# Patient Record
Sex: Female | Born: 1935 | Race: White | Hispanic: No | Marital: Married | Smoking: Never smoker
Health system: Southern US, Community
[De-identification: ages and names within clinical notes are randomized; demographics above are authoritative.]

## PROBLEM LIST (undated history)

## (undated) DIAGNOSIS — E119 Type 2 diabetes mellitus without complications: Secondary | ICD-10-CM

## (undated) DIAGNOSIS — K219 Gastro-esophageal reflux disease without esophagitis: Secondary | ICD-10-CM

## (undated) DIAGNOSIS — E039 Hypothyroidism, unspecified: Secondary | ICD-10-CM

## (undated) DIAGNOSIS — O24410 Gestational diabetes mellitus in pregnancy, diet controlled: Secondary | ICD-10-CM

## (undated) DIAGNOSIS — I499 Cardiac arrhythmia, unspecified: Secondary | ICD-10-CM

## (undated) DIAGNOSIS — C50919 Malignant neoplasm of unspecified site of unspecified female breast: Secondary | ICD-10-CM

## (undated) DIAGNOSIS — I10 Essential (primary) hypertension: Secondary | ICD-10-CM

## (undated) DIAGNOSIS — E785 Hyperlipidemia, unspecified: Secondary | ICD-10-CM

## (undated) DIAGNOSIS — C801 Malignant (primary) neoplasm, unspecified: Secondary | ICD-10-CM

## (undated) DIAGNOSIS — E079 Disorder of thyroid, unspecified: Secondary | ICD-10-CM

## (undated) DIAGNOSIS — K5792 Diverticulitis of intestine, part unspecified, without perforation or abscess without bleeding: Secondary | ICD-10-CM

## (undated) DIAGNOSIS — I509 Heart failure, unspecified: Secondary | ICD-10-CM

## (undated) DIAGNOSIS — I4891 Unspecified atrial fibrillation: Secondary | ICD-10-CM

## (undated) HISTORY — PX: INSERT / REPLACE / REMOVE PACEMAKER: SUR710

---

## 2002-12-01 HISTORY — PX: COLONOSCOPY: SHX174

## 2004-09-25 ENCOUNTER — Observation Stay (HOSPITAL_COMMUNITY): Admission: AD | Admit: 2004-09-25 | Discharge: 2004-09-26 | Payer: Self-pay | Admitting: Ophthalmology

## 2004-11-13 ENCOUNTER — Ambulatory Visit (HOSPITAL_COMMUNITY): Admission: RE | Admit: 2004-11-13 | Discharge: 2004-11-13 | Payer: Self-pay | Admitting: Ophthalmology

## 2005-04-07 ENCOUNTER — Ambulatory Visit (HOSPITAL_COMMUNITY): Admission: RE | Admit: 2005-04-07 | Discharge: 2005-04-07 | Payer: Self-pay | Admitting: Ophthalmology

## 2011-12-05 DIAGNOSIS — I4891 Unspecified atrial fibrillation: Secondary | ICD-10-CM | POA: Diagnosis not present

## 2011-12-05 DIAGNOSIS — Z7901 Long term (current) use of anticoagulants: Secondary | ICD-10-CM | POA: Diagnosis not present

## 2011-12-08 DIAGNOSIS — I10 Essential (primary) hypertension: Secondary | ICD-10-CM | POA: Diagnosis not present

## 2011-12-08 DIAGNOSIS — E119 Type 2 diabetes mellitus without complications: Secondary | ICD-10-CM | POA: Diagnosis not present

## 2011-12-08 DIAGNOSIS — G4700 Insomnia, unspecified: Secondary | ICD-10-CM | POA: Diagnosis not present

## 2011-12-08 DIAGNOSIS — E785 Hyperlipidemia, unspecified: Secondary | ICD-10-CM | POA: Diagnosis not present

## 2011-12-08 DIAGNOSIS — Z7901 Long term (current) use of anticoagulants: Secondary | ICD-10-CM | POA: Diagnosis not present

## 2011-12-31 DIAGNOSIS — Z7901 Long term (current) use of anticoagulants: Secondary | ICD-10-CM | POA: Diagnosis not present

## 2011-12-31 DIAGNOSIS — I4891 Unspecified atrial fibrillation: Secondary | ICD-10-CM | POA: Diagnosis not present

## 2012-01-06 DIAGNOSIS — G4700 Insomnia, unspecified: Secondary | ICD-10-CM | POA: Diagnosis not present

## 2012-01-06 DIAGNOSIS — H902 Conductive hearing loss, unspecified: Secondary | ICD-10-CM | POA: Diagnosis not present

## 2012-01-06 DIAGNOSIS — H6120 Impacted cerumen, unspecified ear: Secondary | ICD-10-CM | POA: Diagnosis not present

## 2012-01-06 DIAGNOSIS — Z6828 Body mass index (BMI) 28.0-28.9, adult: Secondary | ICD-10-CM | POA: Diagnosis not present

## 2012-01-14 DIAGNOSIS — Z7901 Long term (current) use of anticoagulants: Secondary | ICD-10-CM | POA: Diagnosis not present

## 2012-01-19 DIAGNOSIS — I498 Other specified cardiac arrhythmias: Secondary | ICD-10-CM | POA: Diagnosis not present

## 2012-02-10 DIAGNOSIS — Z7901 Long term (current) use of anticoagulants: Secondary | ICD-10-CM | POA: Diagnosis not present

## 2012-02-10 DIAGNOSIS — I4891 Unspecified atrial fibrillation: Secondary | ICD-10-CM | POA: Diagnosis not present

## 2012-02-26 DIAGNOSIS — H353190 Nonexudative age-related macular degeneration, unspecified eye, stage unspecified: Secondary | ICD-10-CM | POA: Diagnosis not present

## 2012-02-26 DIAGNOSIS — H35379 Puckering of macula, unspecified eye: Secondary | ICD-10-CM | POA: Diagnosis not present

## 2012-02-26 DIAGNOSIS — H31009 Unspecified chorioretinal scars, unspecified eye: Secondary | ICD-10-CM | POA: Diagnosis not present

## 2012-03-10 DIAGNOSIS — Z7901 Long term (current) use of anticoagulants: Secondary | ICD-10-CM | POA: Diagnosis not present

## 2012-03-10 DIAGNOSIS — I4891 Unspecified atrial fibrillation: Secondary | ICD-10-CM | POA: Diagnosis not present

## 2012-03-15 DIAGNOSIS — E039 Hypothyroidism, unspecified: Secondary | ICD-10-CM | POA: Diagnosis not present

## 2012-03-15 DIAGNOSIS — E119 Type 2 diabetes mellitus without complications: Secondary | ICD-10-CM | POA: Diagnosis not present

## 2012-03-15 DIAGNOSIS — I10 Essential (primary) hypertension: Secondary | ICD-10-CM | POA: Diagnosis not present

## 2012-03-15 DIAGNOSIS — E785 Hyperlipidemia, unspecified: Secondary | ICD-10-CM | POA: Diagnosis not present

## 2012-03-15 DIAGNOSIS — Z6834 Body mass index (BMI) 34.0-34.9, adult: Secondary | ICD-10-CM | POA: Diagnosis not present

## 2012-03-15 DIAGNOSIS — Z6828 Body mass index (BMI) 28.0-28.9, adult: Secondary | ICD-10-CM | POA: Diagnosis not present

## 2012-04-07 DIAGNOSIS — I4891 Unspecified atrial fibrillation: Secondary | ICD-10-CM | POA: Diagnosis not present

## 2012-04-07 DIAGNOSIS — Z7901 Long term (current) use of anticoagulants: Secondary | ICD-10-CM | POA: Diagnosis not present

## 2012-04-22 DIAGNOSIS — Z950 Presence of cardiac pacemaker: Secondary | ICD-10-CM | POA: Diagnosis not present

## 2012-05-05 DIAGNOSIS — Z7901 Long term (current) use of anticoagulants: Secondary | ICD-10-CM | POA: Diagnosis not present

## 2012-05-05 DIAGNOSIS — I4891 Unspecified atrial fibrillation: Secondary | ICD-10-CM | POA: Diagnosis not present

## 2012-06-07 DIAGNOSIS — I10 Essential (primary) hypertension: Secondary | ICD-10-CM | POA: Diagnosis not present

## 2012-06-07 DIAGNOSIS — I4891 Unspecified atrial fibrillation: Secondary | ICD-10-CM | POA: Diagnosis not present

## 2012-06-07 DIAGNOSIS — Z950 Presence of cardiac pacemaker: Secondary | ICD-10-CM | POA: Diagnosis not present

## 2012-06-07 DIAGNOSIS — E785 Hyperlipidemia, unspecified: Secondary | ICD-10-CM | POA: Diagnosis not present

## 2012-06-10 DIAGNOSIS — W1809XA Striking against other object with subsequent fall, initial encounter: Secondary | ICD-10-CM | POA: Diagnosis not present

## 2012-06-10 DIAGNOSIS — S060X0A Concussion without loss of consciousness, initial encounter: Secondary | ICD-10-CM | POA: Diagnosis not present

## 2012-06-10 DIAGNOSIS — Z7901 Long term (current) use of anticoagulants: Secondary | ICD-10-CM | POA: Diagnosis not present

## 2012-06-10 DIAGNOSIS — S0990XA Unspecified injury of head, initial encounter: Secondary | ICD-10-CM | POA: Diagnosis not present

## 2012-06-21 DIAGNOSIS — E785 Hyperlipidemia, unspecified: Secondary | ICD-10-CM | POA: Diagnosis not present

## 2012-06-21 DIAGNOSIS — Z6827 Body mass index (BMI) 27.0-27.9, adult: Secondary | ICD-10-CM | POA: Diagnosis not present

## 2012-06-21 DIAGNOSIS — E039 Hypothyroidism, unspecified: Secondary | ICD-10-CM | POA: Diagnosis not present

## 2012-06-21 DIAGNOSIS — E119 Type 2 diabetes mellitus without complications: Secondary | ICD-10-CM | POA: Diagnosis not present

## 2012-06-21 DIAGNOSIS — G4700 Insomnia, unspecified: Secondary | ICD-10-CM | POA: Diagnosis not present

## 2012-06-28 DIAGNOSIS — I4891 Unspecified atrial fibrillation: Secondary | ICD-10-CM | POA: Diagnosis not present

## 2012-06-28 DIAGNOSIS — R0602 Shortness of breath: Secondary | ICD-10-CM | POA: Diagnosis not present

## 2012-06-28 DIAGNOSIS — R011 Cardiac murmur, unspecified: Secondary | ICD-10-CM | POA: Diagnosis not present

## 2012-07-15 DIAGNOSIS — E785 Hyperlipidemia, unspecified: Secondary | ICD-10-CM | POA: Diagnosis not present

## 2012-07-15 DIAGNOSIS — I10 Essential (primary) hypertension: Secondary | ICD-10-CM | POA: Diagnosis not present

## 2012-07-15 DIAGNOSIS — I4891 Unspecified atrial fibrillation: Secondary | ICD-10-CM | POA: Diagnosis not present

## 2012-07-19 DIAGNOSIS — G4700 Insomnia, unspecified: Secondary | ICD-10-CM | POA: Diagnosis not present

## 2012-07-19 DIAGNOSIS — Z6827 Body mass index (BMI) 27.0-27.9, adult: Secondary | ICD-10-CM | POA: Diagnosis not present

## 2012-07-19 DIAGNOSIS — E871 Hypo-osmolality and hyponatremia: Secondary | ICD-10-CM | POA: Diagnosis not present

## 2012-07-26 DIAGNOSIS — I498 Other specified cardiac arrhythmias: Secondary | ICD-10-CM | POA: Diagnosis not present

## 2012-08-16 DIAGNOSIS — Z7901 Long term (current) use of anticoagulants: Secondary | ICD-10-CM | POA: Diagnosis not present

## 2012-08-16 DIAGNOSIS — I4891 Unspecified atrial fibrillation: Secondary | ICD-10-CM | POA: Diagnosis not present

## 2012-09-10 DIAGNOSIS — Z23 Encounter for immunization: Secondary | ICD-10-CM | POA: Diagnosis not present

## 2012-09-13 DIAGNOSIS — I4891 Unspecified atrial fibrillation: Secondary | ICD-10-CM | POA: Diagnosis not present

## 2012-09-13 DIAGNOSIS — Z7901 Long term (current) use of anticoagulants: Secondary | ICD-10-CM | POA: Diagnosis not present

## 2012-09-13 DIAGNOSIS — Z950 Presence of cardiac pacemaker: Secondary | ICD-10-CM | POA: Diagnosis not present

## 2012-10-11 DIAGNOSIS — I4891 Unspecified atrial fibrillation: Secondary | ICD-10-CM | POA: Diagnosis not present

## 2012-10-11 DIAGNOSIS — Z7901 Long term (current) use of anticoagulants: Secondary | ICD-10-CM | POA: Diagnosis not present

## 2012-10-11 DIAGNOSIS — Z950 Presence of cardiac pacemaker: Secondary | ICD-10-CM | POA: Diagnosis not present

## 2012-10-21 DIAGNOSIS — E785 Hyperlipidemia, unspecified: Secondary | ICD-10-CM | POA: Diagnosis not present

## 2012-10-21 DIAGNOSIS — E039 Hypothyroidism, unspecified: Secondary | ICD-10-CM | POA: Diagnosis not present

## 2012-10-21 DIAGNOSIS — E119 Type 2 diabetes mellitus without complications: Secondary | ICD-10-CM | POA: Diagnosis not present

## 2012-10-21 DIAGNOSIS — I10 Essential (primary) hypertension: Secondary | ICD-10-CM | POA: Diagnosis not present

## 2012-10-21 DIAGNOSIS — Z6828 Body mass index (BMI) 28.0-28.9, adult: Secondary | ICD-10-CM | POA: Diagnosis not present

## 2012-11-09 DIAGNOSIS — I4891 Unspecified atrial fibrillation: Secondary | ICD-10-CM | POA: Diagnosis not present

## 2012-11-09 DIAGNOSIS — Z7901 Long term (current) use of anticoagulants: Secondary | ICD-10-CM | POA: Diagnosis not present

## 2012-12-07 DIAGNOSIS — Z7901 Long term (current) use of anticoagulants: Secondary | ICD-10-CM | POA: Diagnosis not present

## 2012-12-07 DIAGNOSIS — I4891 Unspecified atrial fibrillation: Secondary | ICD-10-CM | POA: Diagnosis not present

## 2012-12-21 DIAGNOSIS — I4891 Unspecified atrial fibrillation: Secondary | ICD-10-CM | POA: Diagnosis not present

## 2012-12-21 DIAGNOSIS — Z7901 Long term (current) use of anticoagulants: Secondary | ICD-10-CM | POA: Diagnosis not present

## 2013-01-18 DIAGNOSIS — Z7901 Long term (current) use of anticoagulants: Secondary | ICD-10-CM | POA: Diagnosis not present

## 2013-01-18 DIAGNOSIS — I4891 Unspecified atrial fibrillation: Secondary | ICD-10-CM | POA: Diagnosis not present

## 2013-01-24 DIAGNOSIS — Z7901 Long term (current) use of anticoagulants: Secondary | ICD-10-CM | POA: Diagnosis not present

## 2013-01-24 DIAGNOSIS — E785 Hyperlipidemia, unspecified: Secondary | ICD-10-CM | POA: Diagnosis not present

## 2013-01-24 DIAGNOSIS — E119 Type 2 diabetes mellitus without complications: Secondary | ICD-10-CM | POA: Diagnosis not present

## 2013-01-24 DIAGNOSIS — I10 Essential (primary) hypertension: Secondary | ICD-10-CM | POA: Diagnosis not present

## 2013-01-24 DIAGNOSIS — Z6828 Body mass index (BMI) 28.0-28.9, adult: Secondary | ICD-10-CM | POA: Diagnosis not present

## 2013-02-07 DIAGNOSIS — E876 Hypokalemia: Secondary | ICD-10-CM | POA: Diagnosis not present

## 2013-02-14 DIAGNOSIS — I498 Other specified cardiac arrhythmias: Secondary | ICD-10-CM | POA: Diagnosis not present

## 2013-02-16 DIAGNOSIS — Z7901 Long term (current) use of anticoagulants: Secondary | ICD-10-CM | POA: Diagnosis not present

## 2013-02-16 DIAGNOSIS — I4891 Unspecified atrial fibrillation: Secondary | ICD-10-CM | POA: Diagnosis not present

## 2013-03-16 DIAGNOSIS — I4891 Unspecified atrial fibrillation: Secondary | ICD-10-CM | POA: Diagnosis not present

## 2013-03-16 DIAGNOSIS — Z7901 Long term (current) use of anticoagulants: Secondary | ICD-10-CM | POA: Diagnosis not present

## 2013-04-14 DIAGNOSIS — Z7901 Long term (current) use of anticoagulants: Secondary | ICD-10-CM | POA: Diagnosis not present

## 2013-04-14 DIAGNOSIS — I4891 Unspecified atrial fibrillation: Secondary | ICD-10-CM | POA: Diagnosis not present

## 2013-04-28 DIAGNOSIS — Z0000 Encounter for general adult medical examination without abnormal findings: Secondary | ICD-10-CM | POA: Diagnosis not present

## 2013-04-28 DIAGNOSIS — Z7901 Long term (current) use of anticoagulants: Secondary | ICD-10-CM | POA: Diagnosis not present

## 2013-04-28 DIAGNOSIS — E785 Hyperlipidemia, unspecified: Secondary | ICD-10-CM | POA: Diagnosis not present

## 2013-04-28 DIAGNOSIS — Z6828 Body mass index (BMI) 28.0-28.9, adult: Secondary | ICD-10-CM | POA: Diagnosis not present

## 2013-04-28 DIAGNOSIS — Z1331 Encounter for screening for depression: Secondary | ICD-10-CM | POA: Diagnosis not present

## 2013-04-28 DIAGNOSIS — Z9181 History of falling: Secondary | ICD-10-CM | POA: Diagnosis not present

## 2013-04-28 DIAGNOSIS — E119 Type 2 diabetes mellitus without complications: Secondary | ICD-10-CM | POA: Diagnosis not present

## 2013-05-11 DIAGNOSIS — I4891 Unspecified atrial fibrillation: Secondary | ICD-10-CM | POA: Diagnosis not present

## 2013-05-11 DIAGNOSIS — Z7901 Long term (current) use of anticoagulants: Secondary | ICD-10-CM | POA: Diagnosis not present

## 2013-05-26 DIAGNOSIS — Z79899 Other long term (current) drug therapy: Secondary | ICD-10-CM | POA: Diagnosis not present

## 2013-05-26 DIAGNOSIS — I4891 Unspecified atrial fibrillation: Secondary | ICD-10-CM | POA: Diagnosis not present

## 2013-05-26 DIAGNOSIS — Z950 Presence of cardiac pacemaker: Secondary | ICD-10-CM | POA: Diagnosis not present

## 2013-06-08 DIAGNOSIS — I4891 Unspecified atrial fibrillation: Secondary | ICD-10-CM | POA: Diagnosis not present

## 2013-06-08 DIAGNOSIS — Z7901 Long term (current) use of anticoagulants: Secondary | ICD-10-CM | POA: Diagnosis not present

## 2013-07-13 DIAGNOSIS — Z7901 Long term (current) use of anticoagulants: Secondary | ICD-10-CM | POA: Diagnosis not present

## 2013-07-13 DIAGNOSIS — I4891 Unspecified atrial fibrillation: Secondary | ICD-10-CM | POA: Diagnosis not present

## 2013-07-28 DIAGNOSIS — I4891 Unspecified atrial fibrillation: Secondary | ICD-10-CM | POA: Diagnosis not present

## 2013-07-28 DIAGNOSIS — Z7901 Long term (current) use of anticoagulants: Secondary | ICD-10-CM | POA: Diagnosis not present

## 2013-08-24 DIAGNOSIS — Z7901 Long term (current) use of anticoagulants: Secondary | ICD-10-CM | POA: Diagnosis not present

## 2013-08-24 DIAGNOSIS — Z23 Encounter for immunization: Secondary | ICD-10-CM | POA: Diagnosis not present

## 2013-08-24 DIAGNOSIS — I4891 Unspecified atrial fibrillation: Secondary | ICD-10-CM | POA: Diagnosis not present

## 2013-08-29 DIAGNOSIS — I498 Other specified cardiac arrhythmias: Secondary | ICD-10-CM | POA: Diagnosis not present

## 2013-09-01 DIAGNOSIS — E119 Type 2 diabetes mellitus without complications: Secondary | ICD-10-CM | POA: Diagnosis not present

## 2013-09-01 DIAGNOSIS — Z6828 Body mass index (BMI) 28.0-28.9, adult: Secondary | ICD-10-CM | POA: Diagnosis not present

## 2013-09-01 DIAGNOSIS — Z7901 Long term (current) use of anticoagulants: Secondary | ICD-10-CM | POA: Diagnosis not present

## 2013-09-01 DIAGNOSIS — I10 Essential (primary) hypertension: Secondary | ICD-10-CM | POA: Diagnosis not present

## 2013-09-01 DIAGNOSIS — E785 Hyperlipidemia, unspecified: Secondary | ICD-10-CM | POA: Diagnosis not present

## 2013-09-01 DIAGNOSIS — E039 Hypothyroidism, unspecified: Secondary | ICD-10-CM | POA: Diagnosis not present

## 2013-09-12 DIAGNOSIS — Z7901 Long term (current) use of anticoagulants: Secondary | ICD-10-CM | POA: Diagnosis not present

## 2013-09-12 DIAGNOSIS — I4891 Unspecified atrial fibrillation: Secondary | ICD-10-CM | POA: Diagnosis not present

## 2013-10-10 DIAGNOSIS — I4891 Unspecified atrial fibrillation: Secondary | ICD-10-CM | POA: Diagnosis not present

## 2013-10-10 DIAGNOSIS — Z7901 Long term (current) use of anticoagulants: Secondary | ICD-10-CM | POA: Diagnosis not present

## 2013-10-24 DIAGNOSIS — I4891 Unspecified atrial fibrillation: Secondary | ICD-10-CM | POA: Diagnosis not present

## 2013-11-07 DIAGNOSIS — Z7901 Long term (current) use of anticoagulants: Secondary | ICD-10-CM | POA: Diagnosis not present

## 2013-11-07 DIAGNOSIS — I4891 Unspecified atrial fibrillation: Secondary | ICD-10-CM | POA: Diagnosis not present

## 2013-11-28 DIAGNOSIS — Z7901 Long term (current) use of anticoagulants: Secondary | ICD-10-CM | POA: Diagnosis not present

## 2013-11-28 DIAGNOSIS — I4891 Unspecified atrial fibrillation: Secondary | ICD-10-CM | POA: Diagnosis not present

## 2013-12-05 DIAGNOSIS — I498 Other specified cardiac arrhythmias: Secondary | ICD-10-CM | POA: Diagnosis not present

## 2013-12-26 DIAGNOSIS — Z7901 Long term (current) use of anticoagulants: Secondary | ICD-10-CM | POA: Diagnosis not present

## 2013-12-26 DIAGNOSIS — I4891 Unspecified atrial fibrillation: Secondary | ICD-10-CM | POA: Diagnosis not present

## 2014-01-16 DIAGNOSIS — I10 Essential (primary) hypertension: Secondary | ICD-10-CM | POA: Diagnosis not present

## 2014-01-16 DIAGNOSIS — Z961 Presence of intraocular lens: Secondary | ICD-10-CM | POA: Diagnosis not present

## 2014-01-16 DIAGNOSIS — Z9849 Cataract extraction status, unspecified eye: Secondary | ICD-10-CM | POA: Diagnosis not present

## 2014-01-16 DIAGNOSIS — H33059 Total retinal detachment, unspecified eye: Secondary | ICD-10-CM | POA: Diagnosis not present

## 2014-01-19 DIAGNOSIS — I10 Essential (primary) hypertension: Secondary | ICD-10-CM | POA: Diagnosis not present

## 2014-01-19 DIAGNOSIS — Z7901 Long term (current) use of anticoagulants: Secondary | ICD-10-CM | POA: Diagnosis not present

## 2014-01-19 DIAGNOSIS — Z79899 Other long term (current) drug therapy: Secondary | ICD-10-CM | POA: Diagnosis not present

## 2014-01-19 DIAGNOSIS — I4891 Unspecified atrial fibrillation: Secondary | ICD-10-CM | POA: Diagnosis not present

## 2014-01-23 DIAGNOSIS — I4891 Unspecified atrial fibrillation: Secondary | ICD-10-CM | POA: Diagnosis not present

## 2014-01-23 DIAGNOSIS — Z7901 Long term (current) use of anticoagulants: Secondary | ICD-10-CM | POA: Diagnosis not present

## 2014-01-30 DIAGNOSIS — Z7901 Long term (current) use of anticoagulants: Secondary | ICD-10-CM | POA: Diagnosis not present

## 2014-01-30 DIAGNOSIS — I4891 Unspecified atrial fibrillation: Secondary | ICD-10-CM | POA: Diagnosis not present

## 2014-02-07 DIAGNOSIS — Z7901 Long term (current) use of anticoagulants: Secondary | ICD-10-CM | POA: Diagnosis not present

## 2014-02-07 DIAGNOSIS — I4891 Unspecified atrial fibrillation: Secondary | ICD-10-CM | POA: Diagnosis not present

## 2014-02-23 DIAGNOSIS — I4891 Unspecified atrial fibrillation: Secondary | ICD-10-CM | POA: Diagnosis not present

## 2014-02-23 DIAGNOSIS — Z7901 Long term (current) use of anticoagulants: Secondary | ICD-10-CM | POA: Diagnosis not present

## 2014-03-07 DIAGNOSIS — Z7901 Long term (current) use of anticoagulants: Secondary | ICD-10-CM | POA: Diagnosis not present

## 2014-03-07 DIAGNOSIS — I4891 Unspecified atrial fibrillation: Secondary | ICD-10-CM | POA: Diagnosis not present

## 2014-03-13 DIAGNOSIS — I498 Other specified cardiac arrhythmias: Secondary | ICD-10-CM | POA: Diagnosis not present

## 2014-03-21 DIAGNOSIS — I4891 Unspecified atrial fibrillation: Secondary | ICD-10-CM | POA: Diagnosis not present

## 2014-03-21 DIAGNOSIS — Z7901 Long term (current) use of anticoagulants: Secondary | ICD-10-CM | POA: Diagnosis not present

## 2014-03-28 DIAGNOSIS — H903 Sensorineural hearing loss, bilateral: Secondary | ICD-10-CM | POA: Diagnosis not present

## 2014-04-04 DIAGNOSIS — Z7901 Long term (current) use of anticoagulants: Secondary | ICD-10-CM | POA: Diagnosis not present

## 2014-04-04 DIAGNOSIS — I4891 Unspecified atrial fibrillation: Secondary | ICD-10-CM | POA: Diagnosis not present

## 2014-04-26 DIAGNOSIS — Z7901 Long term (current) use of anticoagulants: Secondary | ICD-10-CM | POA: Diagnosis not present

## 2014-04-26 DIAGNOSIS — I4891 Unspecified atrial fibrillation: Secondary | ICD-10-CM | POA: Diagnosis not present

## 2014-05-04 DIAGNOSIS — Z6829 Body mass index (BMI) 29.0-29.9, adult: Secondary | ICD-10-CM | POA: Diagnosis not present

## 2014-05-04 DIAGNOSIS — Z1331 Encounter for screening for depression: Secondary | ICD-10-CM | POA: Diagnosis not present

## 2014-05-04 DIAGNOSIS — I10 Essential (primary) hypertension: Secondary | ICD-10-CM | POA: Diagnosis not present

## 2014-05-04 DIAGNOSIS — E039 Hypothyroidism, unspecified: Secondary | ICD-10-CM | POA: Diagnosis not present

## 2014-05-04 DIAGNOSIS — Z9181 History of falling: Secondary | ICD-10-CM | POA: Diagnosis not present

## 2014-05-04 DIAGNOSIS — E785 Hyperlipidemia, unspecified: Secondary | ICD-10-CM | POA: Diagnosis not present

## 2014-05-04 DIAGNOSIS — E119 Type 2 diabetes mellitus without complications: Secondary | ICD-10-CM | POA: Diagnosis not present

## 2014-05-18 DIAGNOSIS — E876 Hypokalemia: Secondary | ICD-10-CM | POA: Diagnosis not present

## 2014-05-25 DIAGNOSIS — Z950 Presence of cardiac pacemaker: Secondary | ICD-10-CM | POA: Diagnosis not present

## 2014-05-25 DIAGNOSIS — I4891 Unspecified atrial fibrillation: Secondary | ICD-10-CM | POA: Diagnosis not present

## 2014-06-06 DIAGNOSIS — H903 Sensorineural hearing loss, bilateral: Secondary | ICD-10-CM | POA: Diagnosis not present

## 2014-06-12 DIAGNOSIS — I10 Essential (primary) hypertension: Secondary | ICD-10-CM | POA: Diagnosis not present

## 2014-06-12 DIAGNOSIS — I4891 Unspecified atrial fibrillation: Secondary | ICD-10-CM | POA: Diagnosis not present

## 2014-06-12 DIAGNOSIS — Z79899 Other long term (current) drug therapy: Secondary | ICD-10-CM | POA: Diagnosis not present

## 2014-06-12 DIAGNOSIS — Z950 Presence of cardiac pacemaker: Secondary | ICD-10-CM | POA: Diagnosis not present

## 2014-06-12 DIAGNOSIS — E785 Hyperlipidemia, unspecified: Secondary | ICD-10-CM | POA: Diagnosis not present

## 2014-07-04 DIAGNOSIS — Z950 Presence of cardiac pacemaker: Secondary | ICD-10-CM | POA: Diagnosis not present

## 2014-07-04 DIAGNOSIS — I4891 Unspecified atrial fibrillation: Secondary | ICD-10-CM | POA: Diagnosis not present

## 2014-09-07 DIAGNOSIS — I10 Essential (primary) hypertension: Secondary | ICD-10-CM | POA: Diagnosis not present

## 2014-09-07 DIAGNOSIS — E119 Type 2 diabetes mellitus without complications: Secondary | ICD-10-CM | POA: Diagnosis not present

## 2014-09-07 DIAGNOSIS — E785 Hyperlipidemia, unspecified: Secondary | ICD-10-CM | POA: Diagnosis not present

## 2014-09-07 DIAGNOSIS — Z23 Encounter for immunization: Secondary | ICD-10-CM | POA: Diagnosis not present

## 2014-09-07 DIAGNOSIS — R5381 Other malaise: Secondary | ICD-10-CM | POA: Diagnosis not present

## 2014-09-07 DIAGNOSIS — I4891 Unspecified atrial fibrillation: Secondary | ICD-10-CM | POA: Diagnosis not present

## 2014-09-07 DIAGNOSIS — Z6829 Body mass index (BMI) 29.0-29.9, adult: Secondary | ICD-10-CM | POA: Diagnosis not present

## 2014-12-28 DIAGNOSIS — I498 Other specified cardiac arrhythmias: Secondary | ICD-10-CM | POA: Diagnosis not present

## 2015-01-09 DIAGNOSIS — Z6829 Body mass index (BMI) 29.0-29.9, adult: Secondary | ICD-10-CM | POA: Diagnosis not present

## 2015-01-09 DIAGNOSIS — I10 Essential (primary) hypertension: Secondary | ICD-10-CM | POA: Diagnosis not present

## 2015-01-09 DIAGNOSIS — E119 Type 2 diabetes mellitus without complications: Secondary | ICD-10-CM | POA: Diagnosis not present

## 2015-01-09 DIAGNOSIS — I4891 Unspecified atrial fibrillation: Secondary | ICD-10-CM | POA: Diagnosis not present

## 2015-04-12 DIAGNOSIS — H6123 Impacted cerumen, bilateral: Secondary | ICD-10-CM | POA: Diagnosis not present

## 2015-04-19 DIAGNOSIS — M25551 Pain in right hip: Secondary | ICD-10-CM | POA: Diagnosis not present

## 2015-04-19 DIAGNOSIS — M545 Low back pain: Secondary | ICD-10-CM | POA: Diagnosis not present

## 2015-04-23 DIAGNOSIS — M25551 Pain in right hip: Secondary | ICD-10-CM | POA: Diagnosis not present

## 2015-04-23 DIAGNOSIS — M545 Low back pain: Secondary | ICD-10-CM | POA: Diagnosis not present

## 2015-04-23 DIAGNOSIS — M5136 Other intervertebral disc degeneration, lumbar region: Secondary | ICD-10-CM | POA: Diagnosis not present

## 2015-04-23 DIAGNOSIS — M160 Bilateral primary osteoarthritis of hip: Secondary | ICD-10-CM | POA: Diagnosis not present

## 2015-04-23 DIAGNOSIS — M1611 Unilateral primary osteoarthritis, right hip: Secondary | ICD-10-CM | POA: Diagnosis not present

## 2015-04-23 DIAGNOSIS — M47816 Spondylosis without myelopathy or radiculopathy, lumbar region: Secondary | ICD-10-CM | POA: Diagnosis not present

## 2015-05-03 DIAGNOSIS — S39012D Strain of muscle, fascia and tendon of lower back, subsequent encounter: Secondary | ICD-10-CM | POA: Diagnosis not present

## 2015-05-07 DIAGNOSIS — S39012D Strain of muscle, fascia and tendon of lower back, subsequent encounter: Secondary | ICD-10-CM | POA: Diagnosis not present

## 2015-05-11 DIAGNOSIS — S39012D Strain of muscle, fascia and tendon of lower back, subsequent encounter: Secondary | ICD-10-CM | POA: Diagnosis not present

## 2015-05-14 DIAGNOSIS — S39012D Strain of muscle, fascia and tendon of lower back, subsequent encounter: Secondary | ICD-10-CM | POA: Diagnosis not present

## 2015-05-17 DIAGNOSIS — E039 Hypothyroidism, unspecified: Secondary | ICD-10-CM | POA: Diagnosis not present

## 2015-05-17 DIAGNOSIS — I4891 Unspecified atrial fibrillation: Secondary | ICD-10-CM | POA: Diagnosis not present

## 2015-05-17 DIAGNOSIS — I10 Essential (primary) hypertension: Secondary | ICD-10-CM | POA: Diagnosis not present

## 2015-05-17 DIAGNOSIS — M25551 Pain in right hip: Secondary | ICD-10-CM | POA: Diagnosis not present

## 2015-05-17 DIAGNOSIS — Z6829 Body mass index (BMI) 29.0-29.9, adult: Secondary | ICD-10-CM | POA: Diagnosis not present

## 2015-05-17 DIAGNOSIS — E119 Type 2 diabetes mellitus without complications: Secondary | ICD-10-CM | POA: Diagnosis not present

## 2015-05-17 DIAGNOSIS — Z9181 History of falling: Secondary | ICD-10-CM | POA: Diagnosis not present

## 2015-05-18 DIAGNOSIS — S39012D Strain of muscle, fascia and tendon of lower back, subsequent encounter: Secondary | ICD-10-CM | POA: Diagnosis not present

## 2015-05-21 DIAGNOSIS — S39012D Strain of muscle, fascia and tendon of lower back, subsequent encounter: Secondary | ICD-10-CM | POA: Diagnosis not present

## 2015-06-14 DIAGNOSIS — Z6829 Body mass index (BMI) 29.0-29.9, adult: Secondary | ICD-10-CM | POA: Diagnosis not present

## 2015-06-14 DIAGNOSIS — M5441 Lumbago with sciatica, right side: Secondary | ICD-10-CM | POA: Diagnosis not present

## 2015-06-14 DIAGNOSIS — M4727 Other spondylosis with radiculopathy, lumbosacral region: Secondary | ICD-10-CM | POA: Diagnosis not present

## 2015-06-14 DIAGNOSIS — M545 Low back pain: Secondary | ICD-10-CM | POA: Diagnosis not present

## 2015-06-25 DIAGNOSIS — I4891 Unspecified atrial fibrillation: Secondary | ICD-10-CM | POA: Diagnosis not present

## 2015-06-25 DIAGNOSIS — Z6829 Body mass index (BMI) 29.0-29.9, adult: Secondary | ICD-10-CM | POA: Diagnosis not present

## 2015-06-25 DIAGNOSIS — L03115 Cellulitis of right lower limb: Secondary | ICD-10-CM | POA: Diagnosis not present

## 2015-07-05 DIAGNOSIS — M4727 Other spondylosis with radiculopathy, lumbosacral region: Secondary | ICD-10-CM | POA: Diagnosis not present

## 2015-07-05 DIAGNOSIS — Z6828 Body mass index (BMI) 28.0-28.9, adult: Secondary | ICD-10-CM | POA: Diagnosis not present

## 2015-07-05 DIAGNOSIS — L03115 Cellulitis of right lower limb: Secondary | ICD-10-CM | POA: Diagnosis not present

## 2015-07-19 DIAGNOSIS — M4727 Other spondylosis with radiculopathy, lumbosacral region: Secondary | ICD-10-CM | POA: Diagnosis not present

## 2015-07-19 DIAGNOSIS — Z6829 Body mass index (BMI) 29.0-29.9, adult: Secondary | ICD-10-CM | POA: Diagnosis not present

## 2015-07-23 DIAGNOSIS — M5416 Radiculopathy, lumbar region: Secondary | ICD-10-CM | POA: Diagnosis not present

## 2015-07-30 ENCOUNTER — Other Ambulatory Visit: Payer: Self-pay | Admitting: Orthopedic Surgery

## 2015-07-30 DIAGNOSIS — M545 Low back pain: Secondary | ICD-10-CM

## 2015-08-03 DIAGNOSIS — I10 Essential (primary) hypertension: Secondary | ICD-10-CM | POA: Diagnosis present

## 2015-08-03 DIAGNOSIS — R197 Diarrhea, unspecified: Secondary | ICD-10-CM | POA: Diagnosis not present

## 2015-08-03 DIAGNOSIS — E119 Type 2 diabetes mellitus without complications: Secondary | ICD-10-CM | POA: Diagnosis present

## 2015-08-03 DIAGNOSIS — E780 Pure hypercholesterolemia: Secondary | ICD-10-CM | POA: Diagnosis present

## 2015-08-03 DIAGNOSIS — K920 Hematemesis: Secondary | ICD-10-CM | POA: Diagnosis present

## 2015-08-03 DIAGNOSIS — F418 Other specified anxiety disorders: Secondary | ICD-10-CM | POA: Diagnosis present

## 2015-08-03 DIAGNOSIS — R55 Syncope and collapse: Secondary | ICD-10-CM | POA: Diagnosis present

## 2015-08-03 DIAGNOSIS — R112 Nausea with vomiting, unspecified: Secondary | ICD-10-CM | POA: Diagnosis not present

## 2015-08-03 DIAGNOSIS — I482 Chronic atrial fibrillation: Secondary | ICD-10-CM | POA: Diagnosis present

## 2015-08-03 DIAGNOSIS — R110 Nausea: Secondary | ICD-10-CM | POA: Diagnosis not present

## 2015-08-03 DIAGNOSIS — E039 Hypothyroidism, unspecified: Secondary | ICD-10-CM | POA: Diagnosis present

## 2015-08-03 DIAGNOSIS — Z950 Presence of cardiac pacemaker: Secondary | ICD-10-CM | POA: Diagnosis not present

## 2015-08-03 DIAGNOSIS — R404 Transient alteration of awareness: Secondary | ICD-10-CM | POA: Diagnosis not present

## 2015-08-03 DIAGNOSIS — K219 Gastro-esophageal reflux disease without esophagitis: Secondary | ICD-10-CM | POA: Diagnosis present

## 2015-08-03 DIAGNOSIS — D62 Acute posthemorrhagic anemia: Secondary | ICD-10-CM | POA: Diagnosis not present

## 2015-08-03 DIAGNOSIS — K921 Melena: Secondary | ICD-10-CM | POA: Diagnosis present

## 2015-08-03 DIAGNOSIS — Z853 Personal history of malignant neoplasm of breast: Secondary | ICD-10-CM | POA: Diagnosis not present

## 2015-08-03 DIAGNOSIS — E876 Hypokalemia: Secondary | ICD-10-CM | POA: Diagnosis present

## 2015-08-03 DIAGNOSIS — I509 Heart failure, unspecified: Secondary | ICD-10-CM | POA: Diagnosis present

## 2015-08-03 DIAGNOSIS — R531 Weakness: Secondary | ICD-10-CM | POA: Diagnosis not present

## 2015-08-03 DIAGNOSIS — Z79899 Other long term (current) drug therapy: Secondary | ICD-10-CM | POA: Diagnosis not present

## 2015-08-04 ENCOUNTER — Encounter (HOSPITAL_COMMUNITY): Payer: Self-pay | Admitting: Nurse Practitioner

## 2015-08-04 ENCOUNTER — Encounter (HOSPITAL_COMMUNITY)
Admission: AD | Disposition: A | Discharge status: Home / Self Care | Payer: Self-pay | Source: Other Acute Inpatient Hospital | Attending: Internal Medicine

## 2015-08-04 ENCOUNTER — Inpatient Hospital Stay (HOSPITAL_COMMUNITY)
Admission: AD | Admit: 2015-08-04 | Discharge: 2015-08-07 | DRG: 378 | Disposition: A | Discharge status: Home / Self Care | Payer: Medicare Other | Source: Other Acute Inpatient Hospital | Attending: Internal Medicine | Admitting: Internal Medicine

## 2015-08-04 DIAGNOSIS — I4891 Unspecified atrial fibrillation: Secondary | ICD-10-CM | POA: Diagnosis present

## 2015-08-04 DIAGNOSIS — Z23 Encounter for immunization: Secondary | ICD-10-CM

## 2015-08-04 DIAGNOSIS — R110 Nausea: Secondary | ICD-10-CM | POA: Diagnosis not present

## 2015-08-04 DIAGNOSIS — E876 Hypokalemia: Secondary | ICD-10-CM | POA: Diagnosis present

## 2015-08-04 DIAGNOSIS — K921 Melena: Secondary | ICD-10-CM | POA: Diagnosis not present

## 2015-08-04 DIAGNOSIS — E039 Hypothyroidism, unspecified: Secondary | ICD-10-CM | POA: Diagnosis present

## 2015-08-04 DIAGNOSIS — K922 Gastrointestinal hemorrhage, unspecified: Secondary | ICD-10-CM | POA: Diagnosis not present

## 2015-08-04 DIAGNOSIS — K5790 Diverticulosis of intestine, part unspecified, without perforation or abscess without bleeding: Secondary | ICD-10-CM | POA: Diagnosis present

## 2015-08-04 DIAGNOSIS — I10 Essential (primary) hypertension: Secondary | ICD-10-CM | POA: Diagnosis present

## 2015-08-04 DIAGNOSIS — E119 Type 2 diabetes mellitus without complications: Secondary | ICD-10-CM | POA: Diagnosis present

## 2015-08-04 DIAGNOSIS — K449 Diaphragmatic hernia without obstruction or gangrene: Secondary | ICD-10-CM | POA: Diagnosis present

## 2015-08-04 DIAGNOSIS — K219 Gastro-esophageal reflux disease without esophagitis: Secondary | ICD-10-CM | POA: Diagnosis present

## 2015-08-04 DIAGNOSIS — I482 Chronic atrial fibrillation: Secondary | ICD-10-CM

## 2015-08-04 DIAGNOSIS — Z950 Presence of cardiac pacemaker: Secondary | ICD-10-CM

## 2015-08-04 DIAGNOSIS — D62 Acute posthemorrhagic anemia: Secondary | ICD-10-CM | POA: Diagnosis not present

## 2015-08-04 DIAGNOSIS — D500 Iron deficiency anemia secondary to blood loss (chronic): Secondary | ICD-10-CM | POA: Diagnosis not present

## 2015-08-04 DIAGNOSIS — D509 Iron deficiency anemia, unspecified: Secondary | ICD-10-CM | POA: Diagnosis present

## 2015-08-04 DIAGNOSIS — M25551 Pain in right hip: Secondary | ICD-10-CM | POA: Diagnosis present

## 2015-08-04 DIAGNOSIS — Z66 Do not resuscitate: Secondary | ICD-10-CM | POA: Diagnosis present

## 2015-08-04 DIAGNOSIS — Z7901 Long term (current) use of anticoagulants: Secondary | ICD-10-CM | POA: Diagnosis not present

## 2015-08-04 DIAGNOSIS — K254 Chronic or unspecified gastric ulcer with hemorrhage: Secondary | ICD-10-CM | POA: Diagnosis present

## 2015-08-04 DIAGNOSIS — K259 Gastric ulcer, unspecified as acute or chronic, without hemorrhage or perforation: Secondary | ICD-10-CM | POA: Diagnosis not present

## 2015-08-04 DIAGNOSIS — I509 Heart failure, unspecified: Secondary | ICD-10-CM | POA: Diagnosis present

## 2015-08-04 DIAGNOSIS — E785 Hyperlipidemia, unspecified: Secondary | ICD-10-CM | POA: Diagnosis present

## 2015-08-04 DIAGNOSIS — Z853 Personal history of malignant neoplasm of breast: Secondary | ICD-10-CM

## 2015-08-04 DIAGNOSIS — K920 Hematemesis: Secondary | ICD-10-CM | POA: Diagnosis not present

## 2015-08-04 DIAGNOSIS — R55 Syncope and collapse: Secondary | ICD-10-CM | POA: Diagnosis not present

## 2015-08-04 HISTORY — DX: Gastro-esophageal reflux disease without esophagitis: K21.9

## 2015-08-04 HISTORY — DX: Hypothyroidism, unspecified: E03.9

## 2015-08-04 HISTORY — DX: Diverticulitis of intestine, part unspecified, without perforation or abscess without bleeding: K57.92

## 2015-08-04 HISTORY — PX: ESOPHAGOGASTRODUODENOSCOPY: SHX5428

## 2015-08-04 HISTORY — DX: Disorder of thyroid, unspecified: E07.9

## 2015-08-04 HISTORY — DX: Diverticulosis of intestine, part unspecified, without perforation or abscess without bleeding: K57.90

## 2015-08-04 HISTORY — DX: Iron deficiency anemia, unspecified: D50.9

## 2015-08-04 HISTORY — DX: Essential (primary) hypertension: I10

## 2015-08-04 HISTORY — DX: Hyperlipidemia, unspecified: E78.5

## 2015-08-04 HISTORY — DX: Gastrointestinal hemorrhage, unspecified: K92.2

## 2015-08-04 HISTORY — DX: Gestational diabetes mellitus in pregnancy, diet controlled: O24.410

## 2015-08-04 HISTORY — DX: Malignant neoplasm of unspecified site of unspecified female breast: C50.919

## 2015-08-04 HISTORY — DX: Acute posthemorrhagic anemia: D62

## 2015-08-04 HISTORY — DX: Presence of cardiac pacemaker: Z95.0

## 2015-08-04 HISTORY — DX: Heart failure, unspecified: I50.9

## 2015-08-04 HISTORY — DX: Unspecified atrial fibrillation: I48.91

## 2015-08-04 HISTORY — DX: Cardiac arrhythmia, unspecified: I49.9

## 2015-08-04 HISTORY — DX: Malignant (primary) neoplasm, unspecified: C80.1

## 2015-08-04 HISTORY — DX: Type 2 diabetes mellitus without complications: E11.9

## 2015-08-04 LAB — COMPREHENSIVE METABOLIC PANEL
ALT: 13 U/L — ABNORMAL LOW (ref 14–54)
AST: 27 U/L (ref 15–41)
Albumin: 3.2 g/dL — ABNORMAL LOW (ref 3.5–5.0)
Alkaline Phosphatase: 43 U/L (ref 38–126)
Anion gap: 6 (ref 5–15)
BUN: 39 mg/dL — ABNORMAL HIGH (ref 6–20)
CO2: 26 mmol/L (ref 22–32)
Calcium: 8.1 mg/dL — ABNORMAL LOW (ref 8.9–10.3)
Chloride: 107 mmol/L (ref 101–111)
Creatinine, Ser: 0.95 mg/dL (ref 0.44–1.00)
GFR calc Af Amer: >60 mL/min (ref >60)
GFR calc non Af Amer: 53 mL/min — ABNORMAL LOW (ref >60)
Glucose, Bld: 123 mg/dL — ABNORMAL HIGH (ref 65–99)
Potassium: 3.1 mmol/L — ABNORMAL LOW (ref 3.5–5.1)
Sodium: 139 mmol/L (ref 135–145)
Total Bilirubin: 1.6 mg/dL — ABNORMAL HIGH (ref 0.3–1.2)
Total Protein: 5.3 g/dL — ABNORMAL LOW (ref 6.5–8.1)

## 2015-08-04 LAB — MRSA PCR SCREENING: MRSA by PCR: NEGATIVE

## 2015-08-04 LAB — CBC WITH DIFFERENTIAL/PLATELET
Basophils Absolute: 0 10*3/uL (ref 0.0–0.1)
Basophils Relative: 0 % (ref 0–1)
Eosinophils Absolute: 0.2 10*3/uL (ref 0.0–0.7)
Eosinophils Relative: 2 % (ref 0–5)
HCT: 31.8 % — ABNORMAL LOW (ref 36.0–46.0)
Hemoglobin: 10.4 g/dL — ABNORMAL LOW (ref 12.0–15.0)
LYMPHS ABS: 1.5 10*3/uL (ref 0.7–4.0)
LYMPHS PCT: 18 % (ref 12–46)
MCH: 30.9 pg (ref 26.0–34.0)
MCHC: 32.7 g/dL (ref 30.0–36.0)
MCV: 94.4 fL (ref 78.0–100.0)
MONOS PCT: 4 % (ref 3–12)
Monocytes Absolute: 0.3 10*3/uL (ref 0.1–1.0)
Neutro Abs: 6.6 10*3/uL (ref 1.7–7.7)
Neutrophils Relative %: 76 % (ref 43–77)
PLATELETS: 134 10*3/uL — AB (ref 150–400)
RBC: 3.37 MIL/uL — ABNORMAL LOW (ref 3.87–5.11)
RDW: 15.7 % — ABNORMAL HIGH (ref 11.5–15.5)
WBC: 8.7 10*3/uL (ref 4.0–10.5)

## 2015-08-04 LAB — MAGNESIUM: Magnesium: 2.3 mg/dL (ref 1.7–2.4)

## 2015-08-04 LAB — GLUCOSE, CAPILLARY
Glucose-Capillary: 127 mg/dL — ABNORMAL HIGH (ref 65–99)
Glucose-Capillary: 170 mg/dL — ABNORMAL HIGH (ref 65–99)

## 2015-08-04 LAB — TYPE AND SCREEN
ABO/RH(D): O POS
Antibody Screen: NEGATIVE

## 2015-08-04 SURGERY — EGD (ESOPHAGOGASTRODUODENOSCOPY)
Anesthesia: Moderate Sedation

## 2015-08-04 MED ORDER — LEVOTHYROXINE SODIUM 100 MCG IV SOLR
50.0000 ug | Freq: Every day | INTRAVENOUS | Status: DC
  Administered 2015-08-05: 50 ug via INTRAVENOUS
  Filled 2015-08-04: qty 5

## 2015-08-04 MED ORDER — BUTAMBEN-TETRACAINE-BENZOCAINE 2-2-14 % EX AERO
INHALATION_SPRAY | CUTANEOUS | Status: DC | PRN
  Administered 2015-08-04: 2 via TOPICAL

## 2015-08-04 MED ORDER — SODIUM CHLORIDE 0.9 % IJ SOLN
PREFILLED_SYRINGE | INTRAMUSCULAR | Status: DC | PRN
  Administered 2015-08-04: 3.5 mL

## 2015-08-04 MED ORDER — INFLUENZA VAC SPLIT QUAD 0.5 ML IM SUSY
0.5000 mL | PREFILLED_SYRINGE | INTRAMUSCULAR | Status: AC
  Administered 2015-08-05: 0.5 mL via INTRAMUSCULAR
  Filled 2015-08-04: qty 0.5

## 2015-08-04 MED ORDER — SODIUM CHLORIDE 0.9 % IV SOLN
8.0000 mg/h | INTRAVENOUS | Status: DC
  Administered 2015-08-04 – 2015-08-06 (×4): 8 mg/h via INTRAVENOUS
  Filled 2015-08-04 (×8): qty 80

## 2015-08-04 MED ORDER — METOPROLOL TARTRATE 1 MG/ML IV SOLN
5.0000 mg | INTRAVENOUS | Status: DC | PRN
  Administered 2015-08-06: 5 mg via INTRAVENOUS
  Filled 2015-08-04: qty 5

## 2015-08-04 MED ORDER — POTASSIUM CHLORIDE IN NACL 20-0.9 MEQ/L-% IV SOLN
INTRAVENOUS | Status: DC
  Administered 2015-08-04 – 2015-08-05 (×3): via INTRAVENOUS
  Administered 2015-08-06: 50 mL/h via INTRAVENOUS
  Filled 2015-08-04 (×5): qty 1000

## 2015-08-04 MED ORDER — INSULIN ASPART 100 UNIT/ML ~~LOC~~ SOLN
0.0000 [IU] | SUBCUTANEOUS | Status: DC
  Administered 2015-08-04: 1 [IU] via SUBCUTANEOUS
  Administered 2015-08-04: 2 [IU] via SUBCUTANEOUS

## 2015-08-04 MED ORDER — ONDANSETRON HCL 4 MG/2ML IJ SOLN
INTRAMUSCULAR | Status: AC
  Filled 2015-08-04: qty 2

## 2015-08-04 MED ORDER — DIGOXIN 0.25 MG/ML IJ SOLN
0.1250 mg | Freq: Every day | INTRAMUSCULAR | Status: DC
  Administered 2015-08-04 – 2015-08-06 (×3): 0.125 mg via INTRAVENOUS
  Filled 2015-08-04 (×3): qty 0.5

## 2015-08-04 MED ORDER — PNEUMOCOCCAL VAC POLYVALENT 25 MCG/0.5ML IJ INJ
0.5000 mL | INJECTION | INTRAMUSCULAR | Status: AC
  Administered 2015-08-05: 0.5 mL via INTRAMUSCULAR
  Filled 2015-08-04: qty 0.5

## 2015-08-04 MED ORDER — SODIUM CHLORIDE 0.9 % IJ SOLN
3.0000 mL | Freq: Two times a day (BID) | INTRAMUSCULAR | Status: DC
  Administered 2015-08-04 – 2015-08-05 (×2): 3 mL via INTRAVENOUS

## 2015-08-04 MED ORDER — MIDAZOLAM HCL 10 MG/2ML IJ SOLN
INTRAMUSCULAR | Status: DC | PRN
  Administered 2015-08-04: 1 mg via INTRAVENOUS
  Administered 2015-08-04: .5 mg via INTRAVENOUS
  Administered 2015-08-04: 1 mg via INTRAVENOUS

## 2015-08-04 MED ORDER — ONDANSETRON HCL 4 MG/2ML IJ SOLN: 4.0000 mg | Freq: Four times a day (QID) | INTRAMUSCULAR | Status: DC | PRN

## 2015-08-04 MED ORDER — CHLORHEXIDINE GLUCONATE 0.12 % MT SOLN
15.0000 mL | Freq: Two times a day (BID) | OROMUCOSAL | Status: DC
  Administered 2015-08-05 – 2015-08-07 (×3): 15 mL via OROMUCOSAL
  Filled 2015-08-04 (×5): qty 15

## 2015-08-04 MED ORDER — MIDAZOLAM HCL 5 MG/ML IJ SOLN
INTRAMUSCULAR | Status: AC
  Filled 2015-08-04: qty 2

## 2015-08-04 MED ORDER — FENTANYL CITRATE (PF) 100 MCG/2ML IJ SOLN
INTRAMUSCULAR | Status: AC
  Filled 2015-08-04: qty 2

## 2015-08-04 MED ORDER — SODIUM CHLORIDE 0.9 % IV SOLN: Freq: Once | INTRAVENOUS | Status: DC

## 2015-08-04 MED ORDER — SODIUM CHLORIDE 0.9 % IV SOLN
80.0000 mg | Freq: Once | INTRAVENOUS | Status: AC
  Administered 2015-08-04: 80 mg via INTRAVENOUS
  Filled 2015-08-04: qty 80

## 2015-08-04 MED ORDER — CETYLPYRIDINIUM CHLORIDE 0.05 % MT LIQD
7.0000 mL | Freq: Two times a day (BID) | OROMUCOSAL | Status: DC
  Administered 2015-08-05: 7 mL via OROMUCOSAL

## 2015-08-04 NOTE — Op Note (Signed)
[facility] [place], [id number]   ENDOSCOPY PROCEDURE REPORT  PATIENT: [facility], [facility]any Kramer  MR#: [id number] BIRTHDATE: 1926-10-01 , 81  yrs. old GENDER: female ENDOSCOPIST: [name], MD REFERRED BY: PROCEDURE DATE:  Aug 11, 2015 PROCEDURE:  EGD w/ directed submucosal injection(s), any substance  ASA CLASS:     Class III INDICATIONS:  hematemesis and melena. MEDICATIONS: Zofran 4mg  IV and Versed 2.5 mg IV TOPICAL ANESTHETIC: Cetacaine Spray  DESCRIPTION OF PROCEDURE: After the risks benefits and alternatives of the procedure were thoroughly explained, informed consent was obtained.  The PENTAX GASTOROSCOPE [id number] endoscope was introduced through the mouth and advanced to the second portion of the duodenum , Without limitations.  The instrument was slowly withdrawn as the mucosa was fully examined. Estimated blood loss is zero unless otherwise noted in this procedure report.    findings are recorded below       Retroflexed views revealed no abnormalities.     The scope was then withdrawn from the patient and the procedure completed.  COMPLICATIONS: There were no immediate complications.  ENDOSCOPIC IMPRESSION: 1. Small hiatal hernia 2. Multiple small antral erosions 3. Pyloric channel small ulcer with flat red spot could not wash off status post injection with 1-10,000 epi a total of 3 mL over 3 injections 4. Otherwise within normal limits EGD to the second portion of the duodenum without signs of active bleeding  RECOMMENDATIONS: clear liquids only pump inhibitors hold blood thinners probably restart in one to 2 weeks however no aspirin or non-steroidal with them    and need to find a different pain pill since Ultram makes her dizzy and possibly consider a Cox inhibitor in the future after ulcers are healed  REPEAT EXAM: as needed  eSigned:  [name], MD 2015-08-11 4:20 PM    CC:  CPT CODES: ICD CODES:  The ICD and CPT  codes recommended by this software are interpretations from the data that the clinical staff has captured with the software.  The verification of the translation of this report to the ICD and CPT codes and modifiers is the sole responsibility of the health care institution and practicing physician where this report was generated.  [employer] will not be held responsible for the validity of the ICD and CPT codes included on this report.  AMA assumes no liability for data contained or not contained herein. CPT is a Designer, television/film set of the [employer].  PATIENT NAME:  Jaely, Silman MR#: [id number]

## 2015-08-04 NOTE — Consult Note (Signed)
Reason for Consult: Upper GI bleeding Referring Physician: Hospital team  Brittany Kramer is an 81 y.o. female.  HPI: Patient seen and examined and discussed with her and her family and the admitting doctor at [facility] and she really hasn't had much GI problems in a while and she did have an endoscopy and colonoscopy years ago without obvious findings and although she thinks she is on a stomach pill for indigestion she and her daughter do not know the name and it is not written on her medical list but she does take Aleve with her blood thinner for back and leg pain every day and she threw up some bright red blood yesterday and did have some clots in her stool today but only had 1 bloody bowel movement this morning and has no other complaints  Past Medical History  Diagnosis Date  . Atrial fibrillation   . Arrhythmia   . Cancer   . Breast cancer   . Diabetes mellitus without complication   . Gestational diabetes, diet controlled   . Hyperlipidemia   . Hypertension   . Thyroid disease   . Hypothyroidism   . CHF (congestive heart failure)   . GERD (gastroesophageal reflux disease)   . Diverticulitis     Past Surgical History  Procedure Laterality Date  . Insert / replace / remove pacemaker    . Colonoscopy  2004    Family History  Problem Relation Age of Onset  . Heart disease Father   . AAA (abdominal aortic aneurysm) Brother   . AAA (abdominal aortic aneurysm) Mother     Social History:  reports that she has never smoked. She does not have any smokeless tobacco history on file. She reports that she does not drink alcohol or use illicit drugs.  Allergies: Not on FileI have reviewed the patient's current medications.  Medications:   No results found for this or any previous visit (from the past 48 hour(s)).  No results found.  ROS negative except above she denies any lower bowel complaints and has not seen blood before and patient has a pacemaker placed 10-15 years  ago but does not have her card with her and her daughter who is a nurse does not think it requires a magnet Blood pressure 142/67, pulse 52, temperature 98 F (36.7 C), temperature source Oral, resp. rate 20, height 5\' 6"  (1.676 m), weight 69.4 kg (153 lb), SpO2 100 %. Physical Exam vital signs stable afebrile no acute distress exam please see preassessment evaluation labs from [facility] briefly reviewed as was their chart  Assessment/Plan: Upper GI bleeding in a patient on Aleve and blood thinner Plan: The risks benefits methods of endoscopy was discussed with the patient and her family and will proceed this afternoon with further workup and plans pending those findings  [place] E 08/04/2015, 3:41 PM

## 2015-08-04 NOTE — Progress Notes (Signed)
Patient transferred from [facility]. Oriented to unit and room, instructed on callbell and placed at side. Bed alarm on. Family at bedside.

## 2015-08-04 NOTE — H&P (Signed)
[facility] History and Physical                                                                                    Brittany Kramer, is a 81 y.o. female  MRN: [id number]   DOB - [birth year]  Admit Date - 08/04/2015  Outpatient Primary MD for the patient is Brittany Peace, NP  Referring MD: Brittany [facility]ramer EDP / [facility]  Consulting M.D: [facility] / Gastroenterology  With History of -  Past Medical History  Diagnosis Date  . Atrial fibrillation   . Arrhythmia   . Cancer   . Breast cancer   . Diabetes mellitus without complication   . Gestational diabetes, diet controlled   . Hyperlipidemia   . Hypertension   . Thyroid disease   . Hypothyroidism   . CHF (congestive heart failure)   . GERD (gastroesophageal reflux disease)   . Diverticulitis       Past Surgical History  Procedure Laterality Date  . Insert / replace / remove pacemaker    . Colonoscopy  2004    in for acute GI bleeding    HPI This is an 81 yo female patient who was sent from [facility] because of acute GI bleeding with symptomatic anemia. She was transferred because there was no GI coverage at the previous facility. She has a history of atrial fibrillation on eliquis, breast cancer in 1999, diabetes diet-controlled, dyslipidemia, hypertension, hypothyroidism, CHF of unspecified nature, and prior pacemaker placement. She underwent screening colonoscopy by [facility] 2004. She also has a history of GERD and diverticulitis. Patient reports acute onset of hematemesis which was coffee-ground in appearance yesterday a.m. She only had one episode. A few hours later she began having bright red blood per rectum so opted to seek medical attention at [facility]. After arrival to [facility] she had a third bright red bowel movement which was very large volume and subsequently had a syncopal episode with loss of consciousness. Patient's hemoglobin was 7.5 so she has subsequently received 2 units of packed red  blood cells. At the previous facility she also was mildly hypokalemic with potassium of 3.2 so she was given potassium supplementation. Other lab work included BUN of 60 and a creatinine of 1.0. Since onset of GI bleeding patient has noticed increased fatigue and shortness of breath. For the past 3 months she has been taking over-the-counter Aleve twice daily for significant right leg and hip pain and plans were to pursue outpatient CT of the hip and leg this week. As noted she was transferred to [facility] so she can undergo GI evaluation. [facility] was made aware and plans are to pursue endoscopy today. She has been given IV Protonix at the previous facility as well.  Upon arrival to the nursing unit patient was afebrile, BP was 143/51, pulse was irregular 75 bpm and underlying rhythm atrial fibrillation, respiratory rate 15 with oxygen saturations on her percent on 2 L. Orders have been placed to obtain stat CBC, CMET, hemoglobin A1c and magnesium level.   Review of Systems   In addition to the HPI above,  No Fever-chills, myalgias or other constitutional symptoms No  Headache, changes with Vision or hearing, new weakness, tingling, numbness in any extremity, No problems swallowing food or Liquids, indigestion/reflux No Chest pain, Cough, palpitations, orthopnea or DOE No Abdominal pain No dysuria, hematuria or flank pain No new skin rashes, lesions, masses or bruises, No recent weight gain or loss No polyuria, polydypsia or polyphagia,  *A full 10 point Review of Systems was done, except as stated above, all other Review of Systems were negative.  Social History Social History  Substance Use Topics  . Smoking status: Never Smoker   . Smokeless tobacco: Not on file  . Alcohol Use: No    Resides at: Private residence  Lives with: Spouse  Ambulatory status: Without assistive devices   Family History Family History  Problem Relation Age of Onset  . Heart disease Father   . AAA  (abdominal aortic aneurysm) Brother   . AAA (abdominal aortic aneurysm) Mother      Prior to Admission medications   **Has not been formally reconciled by pharmacist -list obtained from ER records from previous facility   Eliquis 2.5 mg twice a day Lipitor 40 mg daily Digoxin 0.125 mg daily Diltiazem ER 120 mg daily Furosemide 40 mg daily Levothyroxine 100 g daily Losartan/hydrochlorothiazide 50-12 0.5 mg daily Toprol-XL 75 mg daily Klor-Con M 20  40 mEq by mouth twice a day Seroquel 75 mg at hour of sleep Sertraline 100 mg daily       Physical Exam  Vitals  Blood pressure 143/51, pulse 75, temperature 97.6 F (36.4 C), temperature source Oral, resp. rate 15, height 5\' 6"  (1.676 m), weight 153 lb (69.4 kg), SpO2 100 %.   General:  In no acute distress, appears younger than stated age and somewhat pale  Psych:  Normal affect, Denies Suicidal or Homicidal ideations, Awake Alert, Oriented X 3. Speech and thought patterns are clear and appropriate  Neuro:   No focal neurological deficits, CN II through XII intact, Strength 5/5 all 4 extremities, Sensation intact all 4 extremities.  ENT:  Ears and Eyes appear Normal, Conjunctivae clear but pale, PER. Moist oral mucosa without erythema or exudates.  Neck:  Supple, No lymphadenopathy appreciated  Respiratory:  Symmetrical chest wall movement, Good air movement bilaterally, CTAB. Room Air  Cardiac: Irregular with underlying atrial fibrillation-ventricular rate 75 bpm, No Murmurs, no LE edema noted, no JVD, No carotid bruits, peripheral pulses palpable at 2+  Abdomen:  Positive bowel sounds, Soft, Non tender, Non distended,  No masses appreciated, no obvious hepatosplenomegaly  Skin:  No Cyanosis, Normal Skin Turgor pale in appearance, No Skin Rash or Bruise.  Extremities: Symmetrical without obvious trauma or injury,  no effusions.  Data Review  CBC No results for input(s): WBC, HGB, HCT, PLT, MCV, MCH, MCHC, RDW,  LYMPHSABS, MONOABS, EOSABS, BASOSABS, BANDABS in the last 168 hours.  Invalid input(s): NEUTRABS, BANDSABD  Chemistries  No results for input(s): NA, K, CL, CO2, GLUCOSE, BUN, CREATININE, CALCIUM, MG, AST, ALT, ALKPHOS, BILITOT in the last 168 hours.  Invalid input(s): GFRCGP  CrCl cannot be calculated (Patient has no serum creatinine result on file.).  No results for input(s): TSH, T4TOTAL, T3FREE, THYROIDAB in the last 72 hours.  Invalid input(s): FREET3  Coagulation profile No results for input(s): INR, PROTIME in the last 168 hours.  No results for input(s): DDIMER in the last 72 hours.  Cardiac Enzymes No results for input(s): CKMB, TROPONINI, MYOGLOBIN in the last 168 hours.  Invalid input(s): CK  Invalid input(s): POCBNP  Urinalysis No  results found for: COLORURINE, APPEARANCEUR, LABSPEC, [place], GLUCOSEU, HGBUR, BILIRUBINUR, KETONESUR, PROTEINUR, UROBILINOGEN, NITRITE, LEUKOCYTESUR  Imaging results:   No results found.   EKG: (Independently reviewed) **from previous facility--atrial fibrillation with ventricular rate 105 bpm, QTC 373 ms, no ST segment or T-wave changes concerning for ischemia   Assessment & Plan  Principal Problem:   Acute GI bleeding/ Diverticulosis/ GERD  -Admit to stepdown -Gastroenterology consulted and patient in process of being transported to endoscopy lab -BUN elevated and with history of recent NSAIDs use and history of known GERD --suspect upper GI source therefore begin Protonix infusion -hold Eliquis -NPO until after endoscopy -IV fluids  Active Problems:   Atrial fibrillation -Currently rate controlled -Since NPO holding Toprol-XL and Cardizem will need to resume ASAP once endoscopy completed and patient allowed at least clear liquid diet -IV digoxin -Hold eliquis-need direction from gastroenterology when appropriate to resume -When necessary IV Lopressor for sustained heart rate >/= 120 bpm    Acute blood loss  anemia -Patient was symptomatic prior to transfer with known syncopal event after large volume bloody bowel movement -Last known hemoglobin 7.5 prior to transfusion at previous facility -Stat CBC pending -Repeat CBC in a.m. -If requires additional PRBCs will need to repeat CBC post transfusion    Diabetes mellitus type 2, diet-controlled -Check BG's every 4 hours and provide SSI    HTN -Currently controlled but holding Toprol, Cardizem, losartan/hydrochlorothiazide until after endoscopy and once proves no additional bleeding and remains hemodynamically stable    Iron deficiency anemia -See above    Hypothyroidism  -Synthroid IV    History of pacemaker -Was placed for tachybradycardia syndrome    CHF  -Unknown subtype -Currently appears compensated; patient is on oxygen in setting of symptomatic anemia    HLD  -Resume statin when diet resumed    DVT Prophylaxis:  SCDs  Family Communication:   Husband and daughter at bedside  Code Status:  DO NOT RESUSCITATE  Condition:  Stable  Discharge disposition:  Anticipate discharge back to home environment once GI bleeding resolved  Time spent in minutes : 60      ELLIS,ALLISON L. ANP on 08/04/2015 at 3:37 PM  Between 7am to 7pm - Pager - [phone]  After 7pm go to [email or web address] - password [email or web address]  And look for the night coverage person covering me after hours  [facility]  Addendum  Patient is a pleasant 81 year old female with a past medical history of atrial fibrillation on chronic anticoagulation with Eliquis, who presents as a transfer from [facility] for further workup of GI bleed. She reported having episode of hematemesis characterizes coffee-ground yesterday morning that was followed by bright red blood per rectum. On presentation was found to have a hemoglobin of 7.5 for which she was type and cross and transfuse with 2 units of packed red blood cells. Patient transferred to [facility] to undergo GI evaluation. She reported taking Aleve 1 tablet by mouth twice a day for the past 3 months for right hip pain. Suspect this reflects an upper GI bleed. On exam patient had benign abdominal exam, clear lungs, appeared pale. GI has been consulted plan for upper endoscopy this evening. Will start her on IV Protonix, continue holding anticoagulation. Will repeat CBC and BMP.  Patient having history of hypertension will hold antihypertensives agents given GI bleed. Will make PRN IV Lopressor available for hypertension.

## 2015-08-05 ENCOUNTER — Encounter (HOSPITAL_COMMUNITY): Payer: Self-pay | Admitting: Gastroenterology

## 2015-08-05 DIAGNOSIS — K254 Chronic or unspecified gastric ulcer with hemorrhage: Secondary | ICD-10-CM | POA: Diagnosis not present

## 2015-08-05 DIAGNOSIS — K922 Gastrointestinal hemorrhage, unspecified: Secondary | ICD-10-CM

## 2015-08-05 LAB — CBC
HEMATOCRIT: 26.5 % — AB (ref 36.0–46.0)
Hemoglobin: 9 g/dL — ABNORMAL LOW (ref 12.0–15.0)
MCH: 31.5 pg (ref 26.0–34.0)
MCHC: 34 g/dL (ref 30.0–36.0)
MCV: 92.7 fL (ref 78.0–100.0)
PLATELETS: 92 10*3/uL — AB (ref 150–400)
RBC: 2.86 MIL/uL — ABNORMAL LOW (ref 3.87–5.11)
RDW: 15.7 % — AB (ref 11.5–15.5)
WBC: 7.4 10*3/uL (ref 4.0–10.5)

## 2015-08-05 LAB — GLUCOSE, CAPILLARY
GLUCOSE-CAPILLARY: 102 mg/dL — AB (ref 65–99)
GLUCOSE-CAPILLARY: 90 mg/dL (ref 65–99)
GLUCOSE-CAPILLARY: 120 mg/dL — AB (ref 65–99)
GLUCOSE-CAPILLARY: 100 mg/dL — AB (ref 65–99)
GLUCOSE-CAPILLARY: 119 mg/dL — AB (ref 65–99)
Glucose-Capillary: 73 mg/dL (ref 65–99)

## 2015-08-05 LAB — BASIC METABOLIC PANEL
Anion gap: 9 (ref 5–15)
BUN: 22 mg/dL — AB (ref 6–20)
CHLORIDE: 106 mmol/L (ref 101–111)
CO2: 23 mmol/L (ref 22–32)
CREATININE: 0.79 mg/dL (ref 0.44–1.00)
Calcium: 7.4 mg/dL — ABNORMAL LOW (ref 8.9–10.3)
GFR calc Af Amer: >60 mL/min (ref >60)
GFR calc non Af Amer: >60 mL/min (ref >60)
GLUCOSE: 89 mg/dL (ref 65–99)
POTASSIUM: 3.1 mmol/L — AB (ref 3.5–5.1)
SODIUM: 138 mmol/L (ref 135–145)

## 2015-08-05 LAB — HEMOGLOBIN AND HEMATOCRIT, BLOOD
HCT: 31.1 % — ABNORMAL LOW (ref 36.0–46.0)
HEMOGLOBIN: 10.1 g/dL — AB (ref 12.0–15.0)

## 2015-08-05 LAB — ABO/RH: ABO/RH(D): O POS

## 2015-08-05 MED ORDER — LEVOTHYROXINE SODIUM 100 MCG PO TABS: 100.0000 ug | ORAL_TABLET | Freq: Every day | ORAL | Status: DC

## 2015-08-05 MED ORDER — DIGOXIN 125 MCG PO TABS: 125.0000 ug | ORAL_TABLET | Freq: Every day | ORAL | Status: DC

## 2015-08-05 MED ORDER — LEVOTHYROXINE SODIUM 100 MCG PO TABS
100.0000 ug | ORAL_TABLET | Freq: Every day | ORAL | Status: DC
  Administered 2015-08-06 – 2015-08-07 (×2): 100 ug via ORAL
  Filled 2015-08-05 (×4): qty 1

## 2015-08-05 MED ORDER — METOPROLOL SUCCINATE ER 25 MG PO TB24
25.0000 mg | ORAL_TABLET | Freq: Every day | ORAL | Status: DC
  Administered 2015-08-05 – 2015-08-06 (×2): 25 mg via ORAL
  Filled 2015-08-05 (×2): qty 1

## 2015-08-05 MED ORDER — POTASSIUM CHLORIDE CRYS ER 20 MEQ PO TBCR
40.0000 meq | EXTENDED_RELEASE_TABLET | Freq: Once | ORAL | Status: AC
  Administered 2015-08-05: 40 meq via ORAL
  Filled 2015-08-05: qty 2

## 2015-08-05 MED ORDER — ATORVASTATIN CALCIUM 40 MG PO TABS
40.0000 mg | ORAL_TABLET | Freq: Every day | ORAL | Status: DC
  Administered 2015-08-05 – 2015-08-06 (×2): 40 mg via ORAL
  Filled 2015-08-05 (×3): qty 1

## 2015-08-05 NOTE — Progress Notes (Signed)
[facility] 11:03 AM  Subjective: Patient doing well without signs of recurrent bleeding and no problems from her endoscopy and tolerating clear liquids but still with some dark or black stools  Objective: Vital signs stable afebrile no acute distress abdomen is soft nontender hemoglobin slight decrease as was BUN and creatinine  Assessment: Upper GI bleeding secondary to pyloric channel ulcer  Plan: If no signs of bleeding may advance diet tomorrow but will need to decide on what blood thinners to use but not to mix non-steroidal's with them  [facility] E  Pager [phone] After 5PM or if no answer call [phone]

## 2015-08-05 NOTE — Progress Notes (Signed)
Patient Demographics  [facility], is a 81 y.o. female, DOB - [birth year], CHE:[id number]  Admit date - 08/04/2015   Admitting Physician [name], MD  Outpatient Primary MD for the patient is [name],[name], NP  LOS - 1   No chief complaint on file.        Subjective:   [facility] today has, No headache, No chest pain, No abdominal pain - No Nausea, No new weakness tingling or numbness, No Cough - SOB. Reports bowel movement overnight with dark color stool.  Assessment & Plan    Principal Problem:   Acute GI bleeding Active Problems:   Atrial fibrillation   Diabetes mellitus type 2, diet-controlled   Hypothyroidism   HTN (hypertension)   History of pacemaker   CHF (congestive heart failure)   Iron deficiency anemia   HLD (hyperlipidemia)   Diverticulosis   GERD (gastroesophageal reflux disease)   Acute blood loss anemia  Acute upper GI bleed - Status post endoscopy 08/04/2015, with pyloric channel ulcer with epinephrine injection. - Hemoglobin 7.5 initially at [facility], transfused 2 units PRBC, on admission here 10.4, today is 9, continue to monitor closely and transfuse as needed. - Continue with PPI - Avoid NSAIDs, continue to hold anticoagulation. - Continue with clear liquid diet, advance tomorrow if hemoglobin remains stable as per GI recommendation.  Atrial fibrillation - Rate controlled - Continue with digoxin, resume low-dose Toprol XL. - Continue to hold Eliquis  Acute blood loss anemia - Status post 2 units PRBC transfusion at [facility]. - Continue to monitor closely - Will start iron supplement on discharge  Diabetes mellitus - Diet controlled, continue to monitor CBG 3 times a day before meals  Hypertension - Overall acceptable, continue to hold Cardizem, losartan, hydrochlorothiazide. - We'll resume low-dose Toprol 25 mg daily, will increase  gradually to home dose of 75(given her history of A. Fib)  Hypothyroidism - Continue with Synthroid  History of tachybradycardia syndrome - Status post permanent pacemaker  Hyperlipidemia - Continue with statin  Hypokalemia - Repleted  Code Status: Full  Family Communication: None at bedside  Disposition Plan: Home when stable   Procedures  EGD 08/04/15 with a pyloric channel ulcer   Consults   Gastroenterology   Medications  Scheduled Meds: . antiseptic oral rinse  7 mL Mouth Rinse q12n4p  . chlorhexidine  15 mL Mouth Rinse BID  . digoxin  0.125 mg Intravenous Daily  . insulin aspart  0-9 Units Subcutaneous 6 times per day  . levothyroxine  50 mcg Intravenous Daily  . sodium chloride  3 mL Intravenous Q12H   Continuous Infusions: . 0.9 % NaCl with KCl 20 mEq / L 50 mL/hr at 08/05/15 0845  . pantoprozole (PROTONIX) infusion 8 mg/hr (08/05/15 0700)   PRN Meds:.metoprolol, ondansetron (ZOFRAN) IV  DVT Prophylaxis   SCDs   Lab Results  Component Value Date   PLT 92* 08/05/2015    Antibiotics    Anti-infectives    None          Objective:   Filed Vitals:   08/04/15 2315 08/05/15 0315 08/05/15 0811 08/05/15 1248  BP: 113/61 98/45 118/60   Pulse: 67 66 73   Temp: 98 F (36.7 C) 98.1 F (36.7  C) 97.3 F (36.3 C) 97.5 F (36.4 C)  TempSrc: Oral Oral Oral Oral  Resp: 25 18 15    Height:      Weight:      SpO2: 98% 98% 98%     Wt Readings from Last 3 Encounters:  08/04/15 69.4 kg (153 lb)     Intake/Output Summary (Last 24 hours) at 08/05/15 1326 Last data filed at 08/05/15 0933  Gross per 24 hour  Intake 2684.17 ml  Output    900 ml  Net 1784.17 ml     Physical Exam  Awake Alert, Oriented X 3, No new F.N deficits, Normal affect [place].AT,PERRAL Supple Neck,No JVD, No cervical lymphadenopathy appriciated.  Symmetrical Chest wall movement, Good air movement bilaterally, CTAB ,No Gallops,Rubs or new Murmurs, No Parasternal Heave +ve  B.Sounds, Abd Soft, No tenderness, No organomegaly appriciated, No rebound - guarding or rigidity. No Cyanosis, Clubbing or edema, No new Rash or bruise     Data Review   Micro Results Recent Results (from the past 240 hour(s))  MRSA PCR Screening     Status: None   Collection Time: 08/04/15  2:55 PM  Result Value Ref Range Status   MRSA by PCR NEGATIVE NEGATIVE Final    Comment:        The GeneXpert MRSA Assay (FDA approved for NASAL specimens only), is one component of a comprehensive MRSA colonization surveillance program. It is not intended to diagnose MRSA infection nor to guide or monitor treatment for MRSA infections.     Radiology Reports No results found.   CBC  Recent Labs Lab 08/04/15 1733 08/05/15 0436  WBC 8.7 7.4  HGB 10.4* 9.0*  HCT 31.8* 26.5*  PLT 134* 92*  MCV 94.4 92.7  MCH 30.9 31.5  MCHC 32.7 34.0  RDW 15.7* 15.7*  LYMPHSABS 1.5  --   MONOABS 0.3  --   EOSABS 0.2  --   BASOSABS 0.0  --     Chemistries   Recent Labs Lab 08/04/15 1733 08/05/15 0436  NA 139 138  K 3.1* 3.1*  CL 107 106  CO2 26 23  GLUCOSE 123* 89  BUN 39* 22*  CREATININE 0.95 0.79  CALCIUM 8.1* 7.4*  MG 2.3  --   AST 27  --   ALT 13*  --   ALKPHOS 43  --   BILITOT 1.6*  --    ------------------------------------------------------------------------------------------------------------------ estimated creatinine clearance is 47.3 mL/min (by C-G formula based on Cr of 0.79). ------------------------------------------------------------------------------------------------------------------ No results for input(s): HGBA1C in the last 72 hours. ------------------------------------------------------------------------------------------------------------------ No results for input(s): CHOL, HDL, LDLCALC, TRIG, CHOLHDL, LDLDIRECT in the last 72 hours. ------------------------------------------------------------------------------------------------------------------ No  results for input(s): TSH, T4TOTAL, T3FREE, THYROIDAB in the last 72 hours.  Invalid input(s): FREET3 ------------------------------------------------------------------------------------------------------------------ No results for input(s): VITAMINB12, FOLATE, FERRITIN, TIBC, IRON, RETICCTPCT in the last 72 hours.  Coagulation profile No results for input(s): INR, PROTIME in the last 168 hours.  No results for input(s): DDIMER in the last 72 hours.  Cardiac Enzymes No results for input(s): CKMB, TROPONINI, MYOGLOBIN in the last 168 hours.  Invalid input(s): CK ------------------------------------------------------------------------------------------------------------------ Invalid input(s): POCBNP     Time Spent in minutes   30  Minutes   Tkai Large M.D on 08/05/2015 at 1:26 PM  Between 7am to 7pm - Pager - [phone]  After 7pm go to [email or web address] - password [email or web address]  [facility]   Office  [phone]

## 2015-08-06 LAB — CBC
HCT: 30.5 % — ABNORMAL LOW (ref 36.0–46.0)
Hemoglobin: 9.9 g/dL — ABNORMAL LOW (ref 12.0–15.0)
MCH: 30.9 pg (ref 26.0–34.0)
MCHC: 32.5 g/dL (ref 30.0–36.0)
MCV: 95.3 fL (ref 78.0–100.0)
PLATELETS: 133 10*3/uL — AB (ref 150–400)
RBC: 3.2 MIL/uL — AB (ref 3.87–5.11)
RDW: 15.5 % (ref 11.5–15.5)
WBC: 7.1 10*3/uL (ref 4.0–10.5)

## 2015-08-06 LAB — BASIC METABOLIC PANEL
ANION GAP: 7 (ref 5–15)
BUN: 9 mg/dL (ref 6–20)
CALCIUM: 7.9 mg/dL — AB (ref 8.9–10.3)
CHLORIDE: 106 mmol/L (ref 101–111)
CO2: 25 mmol/L (ref 22–32)
Creatinine, Ser: 0.75 mg/dL (ref 0.44–1.00)
Glucose, Bld: 107 mg/dL — ABNORMAL HIGH (ref 65–99)
Potassium: 3.7 mmol/L (ref 3.5–5.1)
SODIUM: 138 mmol/L (ref 135–145)

## 2015-08-06 LAB — GLUCOSE, CAPILLARY
GLUCOSE-CAPILLARY: 113 mg/dL — AB (ref 65–99)
GLUCOSE-CAPILLARY: 103 mg/dL — AB (ref 65–99)
Glucose-Capillary: 96 mg/dL (ref 65–99)
Glucose-Capillary: 165 mg/dL — ABNORMAL HIGH (ref 65–99)

## 2015-08-06 MED ORDER — HYDROCODONE-ACETAMINOPHEN 5-325 MG PO TABS
1.0000 | ORAL_TABLET | ORAL | Status: DC | PRN
  Administered 2015-08-06 – 2015-08-07 (×2): 1 via ORAL
  Filled 2015-08-06 (×2): qty 1

## 2015-08-06 MED ORDER — METOPROLOL SUCCINATE ER 50 MG PO TB24
50.0000 mg | ORAL_TABLET | Freq: Every day | ORAL | Status: DC
  Administered 2015-08-07: 50 mg via ORAL
  Filled 2015-08-06: qty 1

## 2015-08-06 MED ORDER — DIGOXIN 125 MCG PO TABS
0.1250 mg | ORAL_TABLET | Freq: Every day | ORAL | Status: DC
  Administered 2015-08-07: 0.125 mg via ORAL
  Filled 2015-08-06: qty 1

## 2015-08-06 MED ORDER — PANTOPRAZOLE SODIUM 40 MG PO TBEC
40.0000 mg | DELAYED_RELEASE_TABLET | Freq: Every day | ORAL | Status: DC
  Administered 2015-08-06 – 2015-08-07 (×2): 40 mg via ORAL
  Filled 2015-08-06 (×2): qty 1

## 2015-08-06 NOTE — Progress Notes (Signed)
[name] 10:40 AM  Subjective: Patient doing well without signs of bleeding but is having a little liquid bowel movement but no other complaints and in good spirits  Objective: Vital signs stable afebrile no acute distress abdomen is soft nontender hemoglobin stable BUN decreased  Assessment: Upper GI bleeding secondary to ulcers and erosions from non-steroidal and blood thinners Plan: Change to by mouth pump inhibitors no aspirin or non-steroidal's as an outpatient Tylenol only or other pain medicines and consider Cox inhibitor in the future and reassess blood thinner needs and please call if I could be of any further assistance and happy to see back as needed  [facility] E  Pager [phone] After 5PM or if no answer call [phone]

## 2015-08-06 NOTE — Progress Notes (Signed)
Patient Demographics  Br[facility]any [facility], is a 81 y.o. female, DOB - 10-14-1926, EGB:[id number]  Admit date - 08/04/2015   Admitting Physician [name], MD  Outpatient Primary MD for the patient is [name],[name], NP  LOS - 2   No chief complaint on file.        Subjective:   [name] today has, No headache, No chest pain, No abdominal pain - No Nausea, No new weakness tingling or numbness, No Cough - SOB. Reports bowel movement this a.m. color is brown and not dark as before.  Assessment & Plan    Principal Problem:   Acute GI bleeding Active Problems:   Atrial fibrillation   Diabetes mellitus type 2, diet-controlled   Hypothyroidism   HTN (hypertension)   History of pacemaker   CHF (congestive heart failure)   Iron deficiency anemia   HLD (hyperlipidemia)   Diverticulosis   GERD (gastroesophageal reflux disease)   Acute blood loss anemia  Acute upper GI bleed - Status post endoscopy 08/04/2015, with pyloric channel ulcer with epinephrine injection. - Hemoglobin 7.5 initially at [facility], transfused 2 units PRBC, on admission here 10.4, today is 9,9 continue to monitor closely and transfuse as needed. - Continue with PPI - Avoid NSAIDs, continue to hold anticoagulation. - Discussed with GI Dr. Watt Climes, will advance to soft diet today, and observe, patient was instructed to avoid NSAIDs for pain, started on Vicodin for pain, will hold Eliquis for the next 2 weeks, can be resumed as an outpatient if the hemoglobin is stable and no recurrence of bleed, patient and husband understand risks stroke and GI bleed, current plan(increased risk of stroke while holding anticoagulation, and increased risk of GI bleed once anticoagulation is resumed), and they are agreeable to plan.  Atrial fibrillation - Rate controlled - Continue with digoxin, resume Toprol XL. - Continue to hold  Eliquis  Acute blood loss anemia - Status post 2 units PRBC transfusion at [facility]. - Continue to monitor closely - Will start iron supplement on discharge  Diabetes mellitus - Diet controlled, continue to monitor CBG 3 times a day before meals  Hypertension - Overall acceptable, continue to hold Cardizem, losartan, hydrochlorothiazide. - We'll resume low-dose Toprol 25 mg daily, will increase gradually to home dose of 75(given her history of A. Fib)  Hypothyroidism - Continue with Synthroid  History of tachybradycardia syndrome - Status post permanent pacemaker  Hyperlipidemia - Continue with statin  Hypokalemia - Repleted  Code Status: Full  Family Communication: Husband at bedside  Disposition Plan: Home in 24 hours of hemoglobin remained stable   Procedures  EGD 08/04/15 with a pyloric channel ulcer   Consults   Gastroenterology   Medications  Scheduled Meds: . antiseptic oral rinse  7 mL Mouth Rinse q12n4p  . atorvastatin  40 mg Oral q1800  . chlorhexidine  15 mL Mouth Rinse BID  . digoxin  0.125 mg Intravenous Daily  . levothyroxine  100 mcg Oral QAC breakfast  . metoprolol succinate  25 mg Oral Daily  . pantoprazole  40 mg Oral Daily  . sodium chloride  3 mL Intravenous Q12H   Continuous Infusions: . 0.9 % NaCl with KCl 20 mEq / L 50 mL/hr at 08/05/15 1439  PRN Meds:.HYDROcodone-acetaminophen, metoprolol, ondansetron (ZOFRAN) IV  DVT Prophylaxis   SCDs   Lab Results  Component Value Date   PLT 133* 08/06/2015    Antibiotics    Anti-infectives    None          Objective:   Filed Vitals:   08/06/15 0414 08/06/15 0715 08/06/15 1152 08/06/15 1514  BP: 117/74 136/74 146/65   Pulse:  74 82   Temp:  97.7 F (36.5 C) 97.5 F (36.4 C) 97.9 F (36.6 C)  TempSrc:  Oral Oral Oral  Resp: 17     Height:      Weight:      SpO2:  95% 100%     Wt Readings from Last 3 Encounters:  08/04/15 69.4 kg (153 lb)      Intake/Output Summary (Last 24 hours) at 08/06/15 1526 Last data filed at 08/06/15 1400  Gross per 24 hour  Intake   1745 ml  Output    200 ml  Net   1545 ml     Physical Exam  Awake Alert, Oriented X 3, No new F.N deficits, Normal affect [place].AT,PERRAL Supple Neck,No JVD, No cervical lymphadenopathy appriciated.  Symmetrical Chest wall movement, Good air movement bilaterally, CTAB ,No Gallops,Rubs or new Murmurs, No Parasternal Heave +ve B.Sounds, Abd Soft, No tenderness, No organomegaly appriciated, No rebound - guarding or rigidity. No Cyanosis, Clubbing or edema, No new Rash or bruise     Data Review   Micro Results Recent Results (from the past 240 hour(s))  MRSA PCR Screening     Status: None   Collection Time: 08/04/15  2:55 PM  Result Value Ref Range Status   MRSA by PCR NEGATIVE NEGATIVE Final    Comment:        The GeneXpert MRSA Assay (FDA approved for NASAL specimens only), is one component of a comprehensive MRSA colonization surveillance program. It is not intended to diagnose MRSA infection nor to guide or monitor treatment for MRSA infections.     Radiology Reports No results found.   CBC  Recent Labs Lab 08/04/15 1733 08/05/15 0436 08/05/15 1832 08/06/15 0300  WBC 8.7 7.4  --  7.1  HGB 10.4* 9.0* 10.1* 9.9*  HCT 31.8* 26.5* 31.1* 30.5*  PLT 134* 92*  --  133*  MCV 94.4 92.7  --  95.3  MCH 30.9 31.5  --  30.9  MCHC 32.7 34.0  --  32.5  RDW 15.7* 15.7*  --  15.5  LYMPHSABS 1.5  --   --   --   MONOABS 0.3  --   --   --   EOSABS 0.2  --   --   --   BASOSABS 0.0  --   --   --     Chemistries   Recent Labs Lab 08/04/15 1733 08/05/15 0436 08/06/15 0300  NA 139 138 138  K 3.1* 3.1* 3.7  CL 107 106 106  CO2 26 23 25   GLUCOSE 123* 89 107*  BUN 39* 22* 9  CREATININE 0.95 0.79 0.75  CALCIUM 8.1* 7.4* 7.9*  MG 2.3  --   --   AST 27  --   --   ALT 13*  --   --   ALKPHOS 43  --   --   BILITOT 1.6*  --   --     ------------------------------------------------------------------------------------------------------------------ estimated creatinine clearance is 47.3 mL/min (by C-G formula based on Cr of 0.75). ------------------------------------------------------------------------------------------------------------------ No results for input(s): HGBA1C in the  last 72 hours. ------------------------------------------------------------------------------------------------------------------ No results for input(s): CHOL, HDL, LDLCALC, TRIG, CHOLHDL, LDLDIRECT in the last 72 hours. ------------------------------------------------------------------------------------------------------------------ No results for input(s): TSH, T4TOTAL, T3FREE, THYROIDAB in the last 72 hours.  Invalid input(s): FREET3 ------------------------------------------------------------------------------------------------------------------ No results for input(s): VITAMINB12, FOLATE, FERRITIN, TIBC, IRON, RETICCTPCT in the last 72 hours.  Coagulation profile No results for input(s): INR, PROTIME in the last 168 hours.  No results for input(s): DDIMER in the last 72 hours.  Cardiac Enzymes No results for input(s): CKMB, TROPONINI, MYOGLOBIN in the last 168 hours.  Invalid input(s): CK ------------------------------------------------------------------------------------------------------------------ Invalid input(s): POCBNP     Time Spent in minutes   30  Minutes   Jonluke Cobbins M.D on 08/06/2015 at 3:26 PM  Between 7am to 7pm - Pager - [phone]  After 7pm go to [email or web address] - password [facility]  [facility]   Office  [phone]

## 2015-08-06 NOTE — Progress Notes (Signed)
Utilization Review Completed.Brittany Kramer T9/04/2015  

## 2015-08-06 NOTE — Progress Notes (Signed)
Patient arrived from 2s. Vitals takes and resting comfortable in the bed. Oriented to the room.  Vineta Carone, Mervin Kung RN

## 2015-08-07 LAB — GLUCOSE, CAPILLARY
GLUCOSE-CAPILLARY: 117 mg/dL — AB (ref 65–99)
Glucose-Capillary: 111 mg/dL — ABNORMAL HIGH (ref 65–99)

## 2015-08-07 LAB — HEMOGLOBIN A1C
Hgb A1c MFr Bld: 6.3 % — ABNORMAL HIGH (ref 4.8–5.6)
Mean Plasma Glucose: 134 mg/dL

## 2015-08-07 LAB — HEMOGLOBIN AND HEMATOCRIT, BLOOD
HCT: 26.7 % — ABNORMAL LOW (ref 36.0–46.0)
Hemoglobin: 8.6 g/dL — ABNORMAL LOW (ref 12.0–15.0)

## 2015-08-07 MED ORDER — PANTOPRAZOLE SODIUM 40 MG PO TBEC: 40.0000 mg | DELAYED_RELEASE_TABLET | Freq: Every day | ORAL | Status: DC

## 2015-08-07 MED ORDER — HYDROCODONE-ACETAMINOPHEN 5-325 MG PO TABS: 1.0000 | ORAL_TABLET | Freq: Four times a day (QID) | ORAL | Status: DC | PRN

## 2015-08-07 MED ORDER — FERROUS SULFATE 325 (65 FE) MG PO TABS: 325.0000 mg | ORAL_TABLET | Freq: Two times a day (BID) | ORAL | Status: DC

## 2015-08-07 NOTE — Care Management Important Message (Signed)
Important Message  Patient Details  Name: Brittany Kramer MRN: [id number] Date of Birth: 1926-11-25   Medicare Important Message Given:  Yes-second notification given    Nathen May 08/07/2015, 11:47 AMImportant Message  Patient Details  Name: Brittany Kramer MRN: [id number] Date of Birth: Nov 04, 1926   Medicare Important Message Given:  Yes-second notification given    Nathen May 08/07/2015, 11:47 AM

## 2015-08-07 NOTE — Discharge Summary (Signed)
Brittany Kramer, is a 81 y.o. female  DOB Apr 25, 1926  MRN [id number].  Admission date:  08/04/2015  Admitting Physician  [name], MD  Discharge Date:  08/07/2015   Primary MD  [name],[name], NP  Recommendations for primary care physician for things to follow:  - Patient will need repeat CBC in 1 week at PCP office. - Patient was instructed to hold requests, still has repeat CBC, hemoglobin remains stable, eliquis can be resumed in 2 weeks . - patient to follow with her primary GI  Admission Diagnosis  GI BLEED GI Bleed   Discharge Diagnosis  GI BLEED GI Bleed    Principal Problem:   Acute GI bleeding Active Problems:   Atrial fibrillation   Diabetes mellitus type 2, diet-controlled   Hypothyroidism   HTN (hypertension)   History of pacemaker   CHF (congestive heart failure)   Iron deficiency anemia   HLD (hyperlipidemia)   Diverticulosis   GERD (gastroesophageal reflux disease)   Acute blood loss anemia      Past Medical History  Diagnosis Date  . Atrial fibrillation   . Arrhythmia   . Cancer   . Breast cancer   . Diabetes mellitus without complication   . Gestational diabetes, diet controlled   . Hyperlipidemia   . Hypertension   . Thyroid disease   . Hypothyroidism   . CHF (congestive heart failure)   . GERD (gastroesophageal reflux disease)   . Diverticulitis     Past Surgical History  Procedure Laterality Date  . Insert / replace / remove pacemaker    . Colonoscopy  2004  . Esophagogastroduodenoscopy N/A 08/04/2015    Procedure: ESOPHAGOGASTRODUODENOSCOPY (EGD);  Surgeon: [name], MD;  Location: [facility] ENDOSCOPY;  Service: Endoscopy;  Laterality: N/A;       History of present illness and  [facility] Course:     Kindly see H&P for history of present illness and admission details, please review complete Labs, Consult reports and Test reports for all details in brief  HPI   from the history and physical done on the day of admission This is an 81 yo female patient who was sent from [facility] because of acute GI bleeding with symptomatic anemia. She was transferred because there was no GI coverage at the previous facility. She has a history of atrial fibrillation on eliquis, breast cancer in 1999, diabetes diet-controlled, dyslipidemia, hypertension, hypothyroidism, CHF of unspecified nature, and prior pacemaker placement. She underwent screening colonoscopy by Dr. Lyndel Safe 2004. She also has a history of GERD and diverticulitis. Patient reports acute onset of hematemesis which was coffee-ground in appearance yesterday a.m. She only had one episode. A few hours later she began having bright red blood per rectum so opted to seek medical attention at [facility]. After arrival to [facility] she had a third bright red bowel movement which was very large volume and subsequently had a syncopal episode with loss of consciousness. Patient's hemoglobin was 7.5 so she has subsequently received 2 units of packed red blood  cells. At the previous facility she also was mildly hypokalemic with potassium of 3.2 so she was given potassium supplementation. Other lab work included BUN of 60 and a creatinine of 1.0. Since onset of GI bleeding patient has noticed increased fatigue and shortness of breath. For the past 3 months she has been taking over-the-counter Aleve twice daily for significant right leg and hip pain and plans were to pursue outpatient CT of the hip and leg this week. As noted she was transferred to [facility] so she can undergo GI evaluation. Dr. Watt Climes was made aware and plans are to pursue endoscopy today. She has been given IV Protonix at the previous facility as well.  Upon arrival to the nursing unit patient was afebrile, BP was 143/51, pulse was irregular 75 bpm and underlying rhythm atrial fibrillation, respiratory rate 15 with oxygen saturations on her percent  on 2 L. Orders have been placed to obtain stat CBC, CMET, hemoglobin A1c and magnesium level.   [facility] Course   Acute upper GI bleed - Status post endoscopy 08/04/2015, with pyloric channel ulcer with epinephrine injection. - Hemoglobin 7.5 initially at [facility], transfused 2 units PRBC, on admission here 10.4, hemoglobin today is 8.6 at day of discharge. - Continue with PPI - Patient was instructed to Avoid NSAIDs,  - Discussed with GI Dr. Watt Climes,  patient was instructed to avoid NSAIDs for pain, started on Vicodin for pain, will hold Eliquis for the next 2 weeks, can be resumed as an outpatient if the hemoglobin is stable and no recurrence of bleed, patient and husband understand risks of stroke and GI bleed with  current plan(increased risk of stroke while holding anticoagulation, and increased risk of GI bleed once anticoagulation is resumed) . - tolerating soft diet, has normal color bowel movement this a.m.   Atrial fibrillation - Rate controlled - Continue with digoxin, resumed Toprol XL. - Continue to hold Eliquis, please see above  Acute blood loss anemia - Status post 2 units PRBC [facility]. - Will start iron supplement on discharge  Diabetes mellitus - Diet controlled, CBGs acceptable during [facility] stay  Hypertension - Resume home medication on discharge  Hypothyroidism - Continue with Synthroid  History of tachybradycardia syndrome - Status post permanent pacemaker  Hyperlipidemia - Continue with statin  Hypokalemia - Repleted     Discharge Condition:  stable   Follow UP  Follow-up Information    Follow up with [facility], NP. Schedule an appointment as soon as possible for a visit in 1 week.   Specialty:  Internal Medicine   Why:  Posthospitalization follow-up, and CBC check.   Contact information:   [place], [place] [address] [phone]         Discharge Instructions  and  Discharge  Medications     Discharge Instructions    Diet - low sodium heart healthy    Complete by:  As directed      Discharge instructions    Complete by:  As directed   Follow with Primary MD [name],[name], NP in 7 days   Get CBC, CMP,  checked  by Primary MD next visit.    Activity: As tolerated with Full fall precautions use walker/cane & assistance as needed   Disposition Home    Diet: Heart Healthy  , with feeding assistance and aspiration precautions.  For Heart failure patients - Check your Weight same time everyday, if you gain over 2 pounds, or you develop  in leg swelling, experience more shortness of breath or chest pain, call your Primary MD immediately. Follow Cardiac Low Salt Diet and 1.5 lit/day fluid restriction.   On your next visit with your primary care physician please Get Medicines reviewed and adjusted.   Please request your Prim.MD to go over all [facility] Tests and Procedure/Radiological results at the follow up, please get all [facility] records sent to your Prim MD by signing [facility] release before you go home.   If you experience worsening of your admission symptoms, develop shortness of breath, life threatening emergency, suicidal or homicidal thoughts you must seek medical attention immediately by calling 911 or calling your MD immediately  if symptoms less severe.  You Must read complete instructions/literature along with all the possible adverse reactions/side effects for all the Medicines you take and that have been prescribed to you. Take any new Medicines after you have completely understood and accpet all the possible adverse reactions/side effects.   Do not drive, operating heavy machinery, perform activities at heights, swimming or participation in water activities or provide baby sitting services if your were admitted for syncope or siezures until you have seen by Primary MD or a Neurologist and advised to do so again.  Do not drive when taking Pain  medications.    Do not take more than prescribed Pain, Sleep and Anxiety Medications  Special Instructions: If you have smoked or chewed Tobacco  in the last 2 yrs please stop smoking, stop any regular Alcohol  and or any Recreational drug use.  Wear Seat belts while driving.   Please note  You were cared for by a hospitalist during your [facility] stay. If you have any questions about your discharge medications or the care you received while you were in the [facility] after you are discharged, you can call the unit and asked to speak with the hospitalist on call if the hospitalist that took care of you is not available. Once you are discharged, your primary care physician will handle any further medical issues. Please note that NO REFILLS for any discharge medications will be authorized once you are discharged, as it is imperative that you return to your primary care physician (or establish a relationship with a primary care physician if you do not have one) for your aftercare needs so that they can reassess your need for medications and monitor your lab values.     Increase activity slowly    Complete by:  As directed             Medication List    STOP taking these medications        ELIQUIS 2.5 MG Tabs tablet  Generic drug:  apixaban      TAKE these medications        atorvastatin 40 MG tablet  Commonly known as:  LIPITOR  Take 40 mg by mouth daily.     digoxin 0.125 MG tablet  Commonly known as:  LANOXIN  Take 125 mcg by mouth daily.     DILT-XR 120 MG 24 hr capsule  Generic drug:  diltiazem  Take 120 mg by mouth daily.     ferrous sulfate 325 (65 FE) MG tablet  Commonly known as:  FERROUSUL  Take 1 tablet (325 mg total) by mouth 2 (two) times daily with a meal.     furosemide 40 MG tablet  Commonly known as:  LASIX  Take 20 mg by mouth daily.     HYDROcodone-acetaminophen 5-325 MG per  tablet  Commonly known as:  NORCO/VICODIN  Take 1 tablet by mouth every 6 (six)  hours as needed for severe pain.     levothyroxine 100 MCG tablet  Commonly known as:  SYNTHROID, LEVOTHROID  Take 100 mcg by mouth daily.     losartan-hydrochlorothiazide 50-12.5 MG per tablet  Commonly known as:  HYZAAR  Take by mouth daily.     metoprolol succinate 50 MG 24 hr tablet  Commonly known as:  TOPROL-XL  Take 75 mg by mouth daily.     mupirocin ointment 2 %  Commonly known as:  BACTROBAN  Apply 1 application topically 2 (two) times daily.     pantoprazole 40 MG tablet  Commonly known as:  PROTONIX  Take 1 tablet (40 mg total) by mouth daily.     potassium chloride SA 20 MEQ tablet  Commonly known as:  K-DUR,KLOR-CON  Take 40 mEq by mouth 2 (two) times daily.     QUEtiapine 50 MG tablet  Commonly known as:  SEROQUEL  Take 50 mg by mouth daily.     sertraline 100 MG tablet  Commonly known as:  ZOLOFT  Take 100 mg by mouth daily.          Diet and Activity recommendation: See Discharge Instructions above   Consults obtained -  Gastroenterology Dr Watt Climes  Major procedures and Radiology Reports - PLEASE review detailed and final reports for all details, in brief -   EGD 08/04/15 with a pyloric channel ulcer Small hiatal hernia 2. Multiple small antral erosions 3. Pyloric channel small ulcer with flat red spot could not wash off status post injection with 1-10,000 epi a total of 3 mL over 3 injections 4. Otherwise within normal limits EGD to the second portion of the duodenum without signs of active bleeding  No results found.  Micro Results     Recent Results (from the past 240 hour(s))  MRSA PCR Screening     Status: None   Collection Time: 08/04/15  2:55 PM  Result Value Ref Range Status   MRSA by PCR NEGATIVE NEGATIVE Final    Comment:        The GeneXpert MRSA Assay (FDA approved for NASAL specimens only), is one component of a comprehensive MRSA colonization surveillance program. It is not intended to diagnose MRSA infection nor to  guide or monitor treatment for MRSA infections.        Today   Subjective:   [name] today has no headache,no chest abdominal pain,no new weakness tingling or numbness, feels much better wants to go home today. BM this am, brown in color this am.  Objective:   Blood pressure 148/64, pulse 68, temperature 97.7 F (36.5 C), temperature source Oral, resp. rate 14, height 5\' 6"  (1.676 m), weight 69.4 kg (153 lb), SpO2 100 %.   Intake/Output Summary (Last 24 hours) at 08/07/15 1058 Last data filed at 08/07/15 0730  Gross per 24 hour  Intake    540 ml  Output      0 ml  Net    540 ml    Exam Awake Alert, Oriented x 3, No new F.N deficits, Normal affect [place].AT,PERRAL Supple Neck,No JVD, No cervical lymphadenopathy appriciated.  Symmetrical Chest wall movement, Good air movement bilaterally, CTAB No Gallops,Rubs or new Murmurs, No Parasternal Heave +ve B.Sounds, Abd Soft, Non tender, No organomegaly appriciated, No rebound -guarding or rigidity. No Cyanosis, Clubbing or edema, No new Rash or bruise  Data Review  CBC w Diff: Lab Results  Component Value Date   WBC 7.1 08/06/2015   HGB 8.6* 08/07/2015   HCT 26.7* 08/07/2015   PLT 133* 08/06/2015   LYMPHOPCT 18 08/04/2015   MONOPCT 4 08/04/2015   EOSPCT 2 08/04/2015   BASOPCT 0 08/04/2015    CMP: Lab Results  Component Value Date   NA 138 08/06/2015   K 3.7 08/06/2015   CL 106 08/06/2015   CO2 25 08/06/2015   BUN 9 08/06/2015   CREATININE 0.75 08/06/2015   PROT 5.3* 08/04/2015   ALBUMIN 3.2* 08/04/2015   BILITOT 1.6* 08/04/2015   ALKPHOS 43 08/04/2015   AST 27 08/04/2015   ALT 13* 08/04/2015  .   Total Time in preparing paper work, data evaluation and todays exam - 35 minutes  ELGERGAWY, DAWOOD M.D on 08/07/2015 at 10:58 AM  [facility]   Office  [phone]

## 2015-08-07 NOTE — Discharge Instructions (Signed)
Follow with Primary MD Brittany Kramer,[name], NP in 7 days   Get CBC, CMP,  checked  by Primary MD next visit.    Activity: As tolerated with Full fall precautions use walker/cane & assistance as needed   Disposition Home    Diet: Heart Healthy  , with feeding assistance and aspiration precautions.  For Heart failure patients - Check your Weight same time everyday, if you gain over 2 pounds, or you develop in leg swelling, experience more shortness of breath or chest pain, call your Primary MD immediately. Follow Cardiac Low Salt Diet and 1.5 lit/day fluid restriction.   On your next visit with your primary care physician please Get Medicines reviewed and adjusted.   Please request your Prim.MD to go over all Hospital Tests and Procedure/Radiological results at the follow up, please get all Hospital records sent to your Prim MD by signing hospital release before you go home.   If you experience worsening of your admission symptoms, develop shortness of breath, life threatening emergency, suicidal or homicidal thoughts you must seek medical attention immediately by calling 911 or calling your MD immediately  if symptoms less severe.  You Must read complete instructions/literature along with all the possible adverse reactions/side effects for all the Medicines you take and that have been prescribed to you. Take any new Medicines after you have completely understood and accpet all the possible adverse reactions/side effects.   Do not drive, operating heavy machinery, perform activities at heights, swimming or participation in water activities or provide baby sitting services if your were admitted for syncope or siezures until you have seen by Primary MD or a Neurologist and advised to do so again.  Do not drive when taking Pain medications.    Do not take more than prescribed Pain, Sleep and Anxiety Medications  Special Instructions: If you have smoked or chewed Tobacco  in the last 2 yrs please  stop smoking, stop any regular Alcohol  and or any Recreational drug use.  Wear Seat belts while driving.   Please note  You were cared for by a hospitalist during your hospital stay. If you have any questions about your discharge medications or the care you received while you were in the hospital after you are discharged, you can call the unit and asked to speak with the hospitalist on call if the hospitalist that took care of you is not available. Once you are discharged, your primary care physician will handle any further medical issues. Please note that NO REFILLS for any discharge medications will be authorized once you are discharged, as it is imperative that you return to your primary care physician (or establish a relationship with a primary care physician if you do not have one) for your aftercare needs so that they can reassess your need for medications and monitor your lab values.

## 2015-08-07 NOTE — Progress Notes (Signed)
Patient discharged home.  Left floor via wheelchair, escorted by nursing staff and family.  Patient and family verbalized understanding of discharge instructions.  Follow up appointment scheduled with PCP on September 15th at 11 am.  All patient belongings sent with patient.  Appears to be in no immediate distress at this time.  Brita Romp, RN

## 2015-08-15 ENCOUNTER — Ambulatory Visit
Admission: RE | Admit: 2015-08-15 | Discharge: 2015-08-15 | Disposition: A | Discharge status: Home / Self Care | Payer: Medicare Other | Source: Ambulatory Visit | Attending: Orthopedic Surgery | Admitting: Orthopedic Surgery

## 2015-08-15 DIAGNOSIS — M545 Low back pain: Secondary | ICD-10-CM

## 2015-08-15 NOTE — Discharge Instructions (Signed)
Myelogram Discharge Instructions  1. Go home and rest quietly for the next 24 hours.  It is important to lie flat for the next 24 hours.  Get up only to go to the restroom.  You may lie in the bed or on a couch on your back, your stomach, your left side or your right side.  You may have one pillow under your head.  You may have pillows between your knees while you are on your side or under your knees while you are on your back.  2. DO NOT drive today.  Recline the seat as far back as it will go, while still wearing your seat belt, on the way home.  3. You may get up to go to the bathroom as needed.  You may sit up for 10 minutes to eat.  You may resume your normal diet and medications unless otherwise indicated.  Drink lots of extra fluids today and tomorrow.  4. The incidence of headache, nausea, or vomiting is about 5% (one in 20 patients).  If you develop a headache, lie flat and drink plenty of fluids until the headache goes away.  Caffeinated beverages may be helpful.  If you develop severe nausea and vomiting or a headache that does not go away with flat bed rest, call [phone].  5. You may resume normal activities after your 24 hours of bed rest is over; however, do not exert yourself strongly or do any heavy lifting tomorrow. If when you get up you have a headache when standing, go back to bed and force fluids for another 24 hours.  6. Call your physician for a follow-up appointment.  The results of your myelogram will be sent directly to your physician by the following day.  7. If you have any questions or if complications develop after you arrive home, please call [phone].  Discharge instructions have been explained to the patient.  The patient, or the person responsible for the patient, fully understands these instructions.      MAY RESUME ELAQUIS TODAY.  May resume Sertraline on Sept. 15, 2016, after 9:30 am.

## 2015-08-16 DIAGNOSIS — I4891 Unspecified atrial fibrillation: Secondary | ICD-10-CM | POA: Diagnosis not present

## 2015-08-16 DIAGNOSIS — I10 Essential (primary) hypertension: Secondary | ICD-10-CM | POA: Diagnosis not present

## 2015-08-16 DIAGNOSIS — K922 Gastrointestinal hemorrhage, unspecified: Secondary | ICD-10-CM | POA: Diagnosis not present

## 2015-08-16 DIAGNOSIS — Z6828 Body mass index (BMI) 28.0-28.9, adult: Secondary | ICD-10-CM | POA: Diagnosis not present

## 2015-08-20 ENCOUNTER — Ambulatory Visit
Admission: RE | Admit: 2015-08-20 | Discharge: 2015-08-20 | Disposition: A | Discharge status: Home / Self Care | Payer: Medicare Other | Source: Ambulatory Visit | Attending: Orthopedic Surgery | Admitting: Orthopedic Surgery

## 2015-08-20 VITALS — BP 134/82 | HR 107

## 2015-08-20 DIAGNOSIS — M4806 Spinal stenosis, lumbar region: Secondary | ICD-10-CM | POA: Diagnosis not present

## 2015-08-20 DIAGNOSIS — M5441 Lumbago with sciatica, right side: Secondary | ICD-10-CM

## 2015-08-20 DIAGNOSIS — M5136 Other intervertebral disc degeneration, lumbar region: Secondary | ICD-10-CM | POA: Diagnosis not present

## 2015-08-20 MED ORDER — IOHEXOL 180 MG/ML  SOLN
15.0000 mL | Freq: Once | INTRAMUSCULAR | Status: DC | PRN
  Administered 2015-08-20: 15 mL via INTRATHECAL

## 2015-08-20 MED ORDER — MEPERIDINE HCL 100 MG/ML IJ SOLN
50.0000 mg | Freq: Once | INTRAMUSCULAR | Status: AC
  Administered 2015-08-20: 50 mg via INTRAMUSCULAR

## 2015-08-20 MED ORDER — ONDANSETRON HCL 4 MG/2ML IJ SOLN
4.0000 mg | Freq: Once | INTRAMUSCULAR | Status: AC
  Administered 2015-08-20: 4 mg via INTRAMUSCULAR

## 2015-08-20 MED ORDER — DIAZEPAM 5 MG PO TABS
5.0000 mg | ORAL_TABLET | Freq: Once | ORAL | Status: AC
  Administered 2015-08-20: 5 mg via ORAL

## 2015-08-20 NOTE — Progress Notes (Signed)
Patient states she has been off Eliquis, Sertraline and Seroquel for at least the past two days.

## 2015-08-20 NOTE — Discharge Instructions (Signed)
Myelogram Discharge Instructions  1. Go home and rest quietly for the next 24 hours.  It is important to lie flat for the next 24 hours.  Get up only to go to the restroom.  You may lie in the bed or on a couch on your back, your stomach, your left side or your right side.  You may have one pillow under your head.  You may have pillows between your knees while you are on your side or under your knees while you are on your back.  2. DO NOT drive today.  Recline the seat as far back as it will go, while still wearing your seat belt, on the way home.  3. You may get up to go to the bathroom as needed.  You may sit up for 10 minutes to eat.  You may resume your normal diet and medications unless otherwise indicated.  Drink lots of extra fluids today and tomorrow.  4. The incidence of headache, nausea, or vomiting is about 5% (one in 20 patients).  If you develop a headache, lie flat and drink plenty of fluids until the headache goes away.  Caffeinated beverages may be helpful.  If you develop severe nausea and vomiting or a headache that does not go away with flat bed rest, call [phone].  5. You may resume normal activities after your 24 hours of bed rest is over; however, do not exert yourself strongly or do any heavy lifting tomorrow. If when you get up you have a headache when standing, go back to bed and force fluids for another 24 hours.  6. Call your physician for a follow-up appointment.  The results of your myelogram will be sent directly to your physician by the following day.  7. If you have any questions or if complications develop after you arrive home, please call [phone].  Discharge instructions have been explained to the patient.  The patient, or the person responsible for the patient, fully understands these instructions.       MAY RESUME ELAQUIS TODAY.  May resume Sertraline and Seroquel on Sept. 20, 2016, after 10:00 am.

## 2015-08-24 DIAGNOSIS — M5416 Radiculopathy, lumbar region: Secondary | ICD-10-CM | POA: Diagnosis not present

## 2015-08-29 DIAGNOSIS — M5416 Radiculopathy, lumbar region: Secondary | ICD-10-CM | POA: Diagnosis not present

## 2015-09-25 DIAGNOSIS — M5416 Radiculopathy, lumbar region: Secondary | ICD-10-CM | POA: Diagnosis not present

## 2015-09-27 DIAGNOSIS — I482 Chronic atrial fibrillation: Secondary | ICD-10-CM | POA: Diagnosis not present

## 2015-09-27 DIAGNOSIS — K263 Acute duodenal ulcer without hemorrhage or perforation: Secondary | ICD-10-CM | POA: Diagnosis not present

## 2015-09-27 DIAGNOSIS — D500 Iron deficiency anemia secondary to blood loss (chronic): Secondary | ICD-10-CM | POA: Diagnosis not present

## 2015-09-27 DIAGNOSIS — Z6828 Body mass index (BMI) 28.0-28.9, adult: Secondary | ICD-10-CM | POA: Diagnosis not present

## 2015-09-27 DIAGNOSIS — I10 Essential (primary) hypertension: Secondary | ICD-10-CM | POA: Diagnosis not present

## 2015-09-27 DIAGNOSIS — E785 Hyperlipidemia, unspecified: Secondary | ICD-10-CM | POA: Diagnosis not present

## 2015-09-27 DIAGNOSIS — E119 Type 2 diabetes mellitus without complications: Secondary | ICD-10-CM | POA: Diagnosis not present

## 2015-10-15 DIAGNOSIS — M5416 Radiculopathy, lumbar region: Secondary | ICD-10-CM | POA: Diagnosis not present

## 2015-10-30 DIAGNOSIS — E782 Mixed hyperlipidemia: Secondary | ICD-10-CM | POA: Diagnosis not present

## 2015-10-30 DIAGNOSIS — I10 Essential (primary) hypertension: Secondary | ICD-10-CM | POA: Diagnosis not present

## 2015-10-30 DIAGNOSIS — I482 Chronic atrial fibrillation: Secondary | ICD-10-CM | POA: Diagnosis not present

## 2015-11-08 DIAGNOSIS — D500 Iron deficiency anemia secondary to blood loss (chronic): Secondary | ICD-10-CM | POA: Diagnosis not present

## 2015-11-08 DIAGNOSIS — Z6827 Body mass index (BMI) 27.0-27.9, adult: Secondary | ICD-10-CM | POA: Diagnosis not present

## 2015-11-08 DIAGNOSIS — I10 Essential (primary) hypertension: Secondary | ICD-10-CM | POA: Diagnosis not present

## 2015-11-15 DIAGNOSIS — I4891 Unspecified atrial fibrillation: Secondary | ICD-10-CM | POA: Diagnosis not present

## 2015-11-15 DIAGNOSIS — E782 Mixed hyperlipidemia: Secondary | ICD-10-CM | POA: Diagnosis not present

## 2015-11-15 DIAGNOSIS — I10 Essential (primary) hypertension: Secondary | ICD-10-CM | POA: Diagnosis not present

## 2015-11-16 DIAGNOSIS — I10 Essential (primary) hypertension: Secondary | ICD-10-CM | POA: Diagnosis not present

## 2015-11-16 DIAGNOSIS — I4891 Unspecified atrial fibrillation: Secondary | ICD-10-CM | POA: Diagnosis not present

## 2015-11-16 DIAGNOSIS — E782 Mixed hyperlipidemia: Secondary | ICD-10-CM | POA: Diagnosis not present

## 2015-12-13 DIAGNOSIS — E782 Mixed hyperlipidemia: Secondary | ICD-10-CM | POA: Diagnosis not present

## 2015-12-13 DIAGNOSIS — I10 Essential (primary) hypertension: Secondary | ICD-10-CM | POA: Diagnosis not present

## 2015-12-13 DIAGNOSIS — I482 Chronic atrial fibrillation: Secondary | ICD-10-CM | POA: Diagnosis not present

## 2015-12-13 DIAGNOSIS — I4891 Unspecified atrial fibrillation: Secondary | ICD-10-CM | POA: Diagnosis not present

## 2016-01-03 DIAGNOSIS — E785 Hyperlipidemia, unspecified: Secondary | ICD-10-CM | POA: Diagnosis not present

## 2016-01-03 DIAGNOSIS — Z6829 Body mass index (BMI) 29.0-29.9, adult: Secondary | ICD-10-CM | POA: Diagnosis not present

## 2016-01-03 DIAGNOSIS — I482 Chronic atrial fibrillation: Secondary | ICD-10-CM | POA: Diagnosis not present

## 2016-01-03 DIAGNOSIS — I10 Essential (primary) hypertension: Secondary | ICD-10-CM | POA: Diagnosis not present

## 2016-01-03 DIAGNOSIS — Z1389 Encounter for screening for other disorder: Secondary | ICD-10-CM | POA: Diagnosis not present

## 2016-01-03 DIAGNOSIS — D500 Iron deficiency anemia secondary to blood loss (chronic): Secondary | ICD-10-CM | POA: Diagnosis not present

## 2016-01-03 DIAGNOSIS — E119 Type 2 diabetes mellitus without complications: Secondary | ICD-10-CM | POA: Diagnosis not present

## 2016-01-17 DIAGNOSIS — Z45018 Encounter for adjustment and management of other part of cardiac pacemaker: Secondary | ICD-10-CM | POA: Diagnosis not present

## 2016-01-17 DIAGNOSIS — I4891 Unspecified atrial fibrillation: Secondary | ICD-10-CM | POA: Diagnosis not present

## 2016-01-17 DIAGNOSIS — E782 Mixed hyperlipidemia: Secondary | ICD-10-CM | POA: Diagnosis not present

## 2016-01-17 DIAGNOSIS — I10 Essential (primary) hypertension: Secondary | ICD-10-CM | POA: Diagnosis not present

## 2016-04-02 DIAGNOSIS — I10 Essential (primary) hypertension: Secondary | ICD-10-CM | POA: Diagnosis not present

## 2016-04-02 DIAGNOSIS — I482 Chronic atrial fibrillation: Secondary | ICD-10-CM | POA: Diagnosis not present

## 2016-04-02 DIAGNOSIS — Z139 Encounter for screening, unspecified: Secondary | ICD-10-CM | POA: Diagnosis not present

## 2016-04-02 DIAGNOSIS — Z6829 Body mass index (BMI) 29.0-29.9, adult: Secondary | ICD-10-CM | POA: Diagnosis not present

## 2016-04-02 DIAGNOSIS — E119 Type 2 diabetes mellitus without complications: Secondary | ICD-10-CM | POA: Diagnosis not present

## 2016-04-02 DIAGNOSIS — E785 Hyperlipidemia, unspecified: Secondary | ICD-10-CM | POA: Diagnosis not present

## 2016-06-12 DIAGNOSIS — I481 Persistent atrial fibrillation: Secondary | ICD-10-CM | POA: Diagnosis not present

## 2016-06-12 DIAGNOSIS — Z45018 Encounter for adjustment and management of other part of cardiac pacemaker: Secondary | ICD-10-CM | POA: Diagnosis not present

## 2016-06-12 DIAGNOSIS — I4891 Unspecified atrial fibrillation: Secondary | ICD-10-CM | POA: Diagnosis not present

## 2016-07-07 DIAGNOSIS — I482 Chronic atrial fibrillation: Secondary | ICD-10-CM | POA: Diagnosis not present

## 2016-07-07 DIAGNOSIS — I10 Essential (primary) hypertension: Secondary | ICD-10-CM | POA: Diagnosis not present

## 2016-07-07 DIAGNOSIS — Z7901 Long term (current) use of anticoagulants: Secondary | ICD-10-CM | POA: Diagnosis not present

## 2016-07-07 DIAGNOSIS — E785 Hyperlipidemia, unspecified: Secondary | ICD-10-CM | POA: Diagnosis not present

## 2016-07-07 DIAGNOSIS — E039 Hypothyroidism, unspecified: Secondary | ICD-10-CM | POA: Diagnosis not present

## 2016-07-07 DIAGNOSIS — G4700 Insomnia, unspecified: Secondary | ICD-10-CM | POA: Diagnosis not present

## 2016-07-07 DIAGNOSIS — E119 Type 2 diabetes mellitus without complications: Secondary | ICD-10-CM | POA: Diagnosis not present

## 2016-07-29 DIAGNOSIS — Z45018 Encounter for adjustment and management of other part of cardiac pacemaker: Secondary | ICD-10-CM | POA: Diagnosis not present

## 2016-07-29 DIAGNOSIS — I10 Essential (primary) hypertension: Secondary | ICD-10-CM | POA: Diagnosis not present

## 2016-07-29 DIAGNOSIS — I4891 Unspecified atrial fibrillation: Secondary | ICD-10-CM | POA: Diagnosis not present

## 2016-08-07 DIAGNOSIS — I10 Essential (primary) hypertension: Secondary | ICD-10-CM | POA: Diagnosis not present

## 2016-08-14 DIAGNOSIS — Z45018 Encounter for adjustment and management of other part of cardiac pacemaker: Secondary | ICD-10-CM | POA: Diagnosis not present

## 2016-08-14 DIAGNOSIS — I495 Sick sinus syndrome: Secondary | ICD-10-CM | POA: Insufficient documentation

## 2016-08-14 DIAGNOSIS — I10 Essential (primary) hypertension: Secondary | ICD-10-CM | POA: Diagnosis not present

## 2016-08-14 DIAGNOSIS — I4891 Unspecified atrial fibrillation: Secondary | ICD-10-CM | POA: Diagnosis not present

## 2016-08-14 HISTORY — DX: Sick sinus syndrome: I49.5

## 2016-09-02 DIAGNOSIS — I4891 Unspecified atrial fibrillation: Secondary | ICD-10-CM | POA: Diagnosis not present

## 2016-09-02 DIAGNOSIS — I10 Essential (primary) hypertension: Secondary | ICD-10-CM | POA: Diagnosis not present

## 2016-09-02 DIAGNOSIS — Z45018 Encounter for adjustment and management of other part of cardiac pacemaker: Secondary | ICD-10-CM | POA: Diagnosis not present

## 2016-09-02 DIAGNOSIS — E782 Mixed hyperlipidemia: Secondary | ICD-10-CM | POA: Diagnosis not present

## 2016-09-02 DIAGNOSIS — I495 Sick sinus syndrome: Secondary | ICD-10-CM | POA: Diagnosis not present

## 2016-10-03 DIAGNOSIS — I110 Hypertensive heart disease with heart failure: Secondary | ICD-10-CM | POA: Diagnosis not present

## 2016-10-03 DIAGNOSIS — Z23 Encounter for immunization: Secondary | ICD-10-CM | POA: Diagnosis not present

## 2016-10-03 DIAGNOSIS — R4701 Aphasia: Secondary | ICD-10-CM | POA: Diagnosis not present

## 2016-10-03 DIAGNOSIS — I5032 Chronic diastolic (congestive) heart failure: Secondary | ICD-10-CM | POA: Diagnosis not present

## 2016-10-03 DIAGNOSIS — G453 Amaurosis fugax: Secondary | ICD-10-CM | POA: Diagnosis not present

## 2016-10-03 DIAGNOSIS — I10 Essential (primary) hypertension: Secondary | ICD-10-CM | POA: Diagnosis not present

## 2016-10-03 DIAGNOSIS — R51 Headache: Secondary | ICD-10-CM | POA: Diagnosis not present

## 2016-10-03 DIAGNOSIS — F329 Major depressive disorder, single episode, unspecified: Secondary | ICD-10-CM

## 2016-10-03 DIAGNOSIS — K219 Gastro-esophageal reflux disease without esophagitis: Secondary | ICD-10-CM

## 2016-10-03 DIAGNOSIS — E039 Hypothyroidism, unspecified: Secondary | ICD-10-CM | POA: Diagnosis not present

## 2016-10-03 DIAGNOSIS — H538 Other visual disturbances: Secondary | ICD-10-CM | POA: Diagnosis not present

## 2016-10-03 DIAGNOSIS — Z79899 Other long term (current) drug therapy: Secondary | ICD-10-CM | POA: Diagnosis not present

## 2016-10-03 DIAGNOSIS — R7303 Prediabetes: Secondary | ICD-10-CM | POA: Diagnosis not present

## 2016-10-03 DIAGNOSIS — I509 Heart failure, unspecified: Secondary | ICD-10-CM | POA: Diagnosis not present

## 2016-10-03 DIAGNOSIS — I6523 Occlusion and stenosis of bilateral carotid arteries: Secondary | ICD-10-CM | POA: Diagnosis not present

## 2016-10-03 DIAGNOSIS — Z66 Do not resuscitate: Secondary | ICD-10-CM | POA: Diagnosis not present

## 2016-10-03 DIAGNOSIS — Z950 Presence of cardiac pacemaker: Secondary | ICD-10-CM

## 2016-10-03 DIAGNOSIS — I482 Chronic atrial fibrillation: Secondary | ICD-10-CM | POA: Diagnosis not present

## 2016-10-03 DIAGNOSIS — Z7901 Long term (current) use of anticoagulants: Secondary | ICD-10-CM

## 2016-10-03 DIAGNOSIS — E7800 Pure hypercholesterolemia, unspecified: Secondary | ICD-10-CM | POA: Diagnosis not present

## 2016-10-03 DIAGNOSIS — G459 Transient cerebral ischemic attack, unspecified: Secondary | ICD-10-CM | POA: Diagnosis not present

## 2016-10-03 DIAGNOSIS — I4891 Unspecified atrial fibrillation: Secondary | ICD-10-CM

## 2016-10-03 DIAGNOSIS — K589 Irritable bowel syndrome without diarrhea: Secondary | ICD-10-CM | POA: Diagnosis not present

## 2016-10-04 DIAGNOSIS — I5032 Chronic diastolic (congestive) heart failure: Secondary | ICD-10-CM | POA: Diagnosis not present

## 2016-10-04 DIAGNOSIS — I480 Paroxysmal atrial fibrillation: Secondary | ICD-10-CM | POA: Diagnosis not present

## 2016-10-04 DIAGNOSIS — I10 Essential (primary) hypertension: Secondary | ICD-10-CM | POA: Diagnosis not present

## 2016-10-04 DIAGNOSIS — E039 Hypothyroidism, unspecified: Secondary | ICD-10-CM | POA: Diagnosis not present

## 2016-10-04 DIAGNOSIS — G459 Transient cerebral ischemic attack, unspecified: Secondary | ICD-10-CM | POA: Diagnosis not present

## 2016-10-04 DIAGNOSIS — R51 Headache: Secondary | ICD-10-CM | POA: Diagnosis not present

## 2016-10-07 DIAGNOSIS — G459 Transient cerebral ischemic attack, unspecified: Secondary | ICD-10-CM | POA: Diagnosis not present

## 2016-10-07 DIAGNOSIS — E119 Type 2 diabetes mellitus without complications: Secondary | ICD-10-CM | POA: Diagnosis not present

## 2016-10-07 DIAGNOSIS — G4700 Insomnia, unspecified: Secondary | ICD-10-CM | POA: Diagnosis not present

## 2016-10-07 DIAGNOSIS — Z79899 Other long term (current) drug therapy: Secondary | ICD-10-CM | POA: Diagnosis not present

## 2016-10-07 DIAGNOSIS — E039 Hypothyroidism, unspecified: Secondary | ICD-10-CM | POA: Diagnosis not present

## 2016-10-07 DIAGNOSIS — E785 Hyperlipidemia, unspecified: Secondary | ICD-10-CM | POA: Diagnosis not present

## 2016-10-07 DIAGNOSIS — I482 Chronic atrial fibrillation: Secondary | ICD-10-CM | POA: Diagnosis not present

## 2016-10-07 DIAGNOSIS — I10 Essential (primary) hypertension: Secondary | ICD-10-CM | POA: Diagnosis not present

## 2016-10-20 DIAGNOSIS — R413 Other amnesia: Secondary | ICD-10-CM | POA: Diagnosis not present

## 2016-10-20 DIAGNOSIS — G459 Transient cerebral ischemic attack, unspecified: Secondary | ICD-10-CM | POA: Diagnosis not present

## 2016-10-20 DIAGNOSIS — I10 Essential (primary) hypertension: Secondary | ICD-10-CM | POA: Diagnosis not present

## 2016-10-20 DIAGNOSIS — I482 Chronic atrial fibrillation: Secondary | ICD-10-CM | POA: Diagnosis not present

## 2016-11-13 DIAGNOSIS — Z950 Presence of cardiac pacemaker: Secondary | ICD-10-CM | POA: Diagnosis not present

## 2016-11-13 DIAGNOSIS — Z45018 Encounter for adjustment and management of other part of cardiac pacemaker: Secondary | ICD-10-CM | POA: Diagnosis not present

## 2016-11-13 DIAGNOSIS — I495 Sick sinus syndrome: Secondary | ICD-10-CM | POA: Diagnosis not present

## 2017-01-01 DIAGNOSIS — Z45010 Encounter for checking and testing of cardiac pacemaker pulse generator [battery]: Secondary | ICD-10-CM | POA: Diagnosis not present

## 2017-01-07 DIAGNOSIS — Z6828 Body mass index (BMI) 28.0-28.9, adult: Secondary | ICD-10-CM | POA: Diagnosis not present

## 2017-01-07 DIAGNOSIS — E785 Hyperlipidemia, unspecified: Secondary | ICD-10-CM | POA: Diagnosis not present

## 2017-01-07 DIAGNOSIS — G459 Transient cerebral ischemic attack, unspecified: Secondary | ICD-10-CM | POA: Diagnosis not present

## 2017-01-07 DIAGNOSIS — Z1389 Encounter for screening for other disorder: Secondary | ICD-10-CM | POA: Diagnosis not present

## 2017-01-07 DIAGNOSIS — Z9181 History of falling: Secondary | ICD-10-CM | POA: Diagnosis not present

## 2017-01-07 DIAGNOSIS — I10 Essential (primary) hypertension: Secondary | ICD-10-CM | POA: Diagnosis not present

## 2017-01-07 DIAGNOSIS — Z7901 Long term (current) use of anticoagulants: Secondary | ICD-10-CM | POA: Diagnosis not present

## 2017-01-07 DIAGNOSIS — G4700 Insomnia, unspecified: Secondary | ICD-10-CM | POA: Diagnosis not present

## 2017-01-07 DIAGNOSIS — E119 Type 2 diabetes mellitus without complications: Secondary | ICD-10-CM | POA: Diagnosis not present

## 2017-01-07 DIAGNOSIS — I482 Chronic atrial fibrillation: Secondary | ICD-10-CM | POA: Diagnosis not present

## 2017-01-19 DIAGNOSIS — Z79899 Other long term (current) drug therapy: Secondary | ICD-10-CM | POA: Diagnosis not present

## 2017-02-16 DIAGNOSIS — R040 Epistaxis: Secondary | ICD-10-CM | POA: Diagnosis not present

## 2017-02-16 DIAGNOSIS — Z6829 Body mass index (BMI) 29.0-29.9, adult: Secondary | ICD-10-CM | POA: Diagnosis not present

## 2017-02-23 DIAGNOSIS — R040 Epistaxis: Secondary | ICD-10-CM | POA: Diagnosis not present

## 2017-02-23 DIAGNOSIS — H6123 Impacted cerumen, bilateral: Secondary | ICD-10-CM | POA: Diagnosis not present

## 2017-02-23 DIAGNOSIS — J3489 Other specified disorders of nose and nasal sinuses: Secondary | ICD-10-CM | POA: Diagnosis not present

## 2017-02-23 DIAGNOSIS — Z7901 Long term (current) use of anticoagulants: Secondary | ICD-10-CM | POA: Diagnosis not present

## 2017-02-23 DIAGNOSIS — J342 Deviated nasal septum: Secondary | ICD-10-CM | POA: Diagnosis not present

## 2017-02-26 DIAGNOSIS — R040 Epistaxis: Secondary | ICD-10-CM | POA: Diagnosis not present

## 2017-02-26 DIAGNOSIS — J3489 Other specified disorders of nose and nasal sinuses: Secondary | ICD-10-CM | POA: Diagnosis not present

## 2017-02-26 DIAGNOSIS — Z7901 Long term (current) use of anticoagulants: Secondary | ICD-10-CM | POA: Diagnosis not present

## 2017-02-26 DIAGNOSIS — J342 Deviated nasal septum: Secondary | ICD-10-CM | POA: Diagnosis not present

## 2017-03-02 DIAGNOSIS — E782 Mixed hyperlipidemia: Secondary | ICD-10-CM | POA: Diagnosis not present

## 2017-03-02 DIAGNOSIS — I495 Sick sinus syndrome: Secondary | ICD-10-CM | POA: Diagnosis not present

## 2017-03-02 DIAGNOSIS — I482 Chronic atrial fibrillation: Secondary | ICD-10-CM | POA: Diagnosis not present

## 2017-03-02 DIAGNOSIS — Z45018 Encounter for adjustment and management of other part of cardiac pacemaker: Secondary | ICD-10-CM | POA: Diagnosis not present

## 2017-03-02 DIAGNOSIS — I10 Essential (primary) hypertension: Secondary | ICD-10-CM | POA: Diagnosis not present

## 2017-03-11 DIAGNOSIS — I482 Chronic atrial fibrillation: Secondary | ICD-10-CM | POA: Diagnosis not present

## 2017-03-11 DIAGNOSIS — E782 Mixed hyperlipidemia: Secondary | ICD-10-CM | POA: Diagnosis not present

## 2017-03-11 DIAGNOSIS — Z45018 Encounter for adjustment and management of other part of cardiac pacemaker: Secondary | ICD-10-CM | POA: Diagnosis not present

## 2017-03-11 DIAGNOSIS — I495 Sick sinus syndrome: Secondary | ICD-10-CM | POA: Diagnosis not present

## 2017-03-11 DIAGNOSIS — I10 Essential (primary) hypertension: Secondary | ICD-10-CM | POA: Diagnosis not present

## 2017-03-20 DIAGNOSIS — H6122 Impacted cerumen, left ear: Secondary | ICD-10-CM | POA: Diagnosis not present

## 2017-03-20 DIAGNOSIS — H900 Conductive hearing loss, bilateral: Secondary | ICD-10-CM | POA: Diagnosis not present

## 2017-04-08 DIAGNOSIS — G4700 Insomnia, unspecified: Secondary | ICD-10-CM | POA: Diagnosis not present

## 2017-04-08 DIAGNOSIS — Z139 Encounter for screening, unspecified: Secondary | ICD-10-CM | POA: Diagnosis not present

## 2017-04-08 DIAGNOSIS — I482 Chronic atrial fibrillation: Secondary | ICD-10-CM | POA: Diagnosis not present

## 2017-04-08 DIAGNOSIS — E119 Type 2 diabetes mellitus without complications: Secondary | ICD-10-CM | POA: Diagnosis not present

## 2017-04-08 DIAGNOSIS — E785 Hyperlipidemia, unspecified: Secondary | ICD-10-CM | POA: Diagnosis not present

## 2017-04-08 DIAGNOSIS — I10 Essential (primary) hypertension: Secondary | ICD-10-CM | POA: Diagnosis not present

## 2017-05-14 IMAGING — CT CT L SPINE W/ CM
3 of 11 series · 10 of 34 positions shown, 11 images · non-contrast
Comparison: None

CLINICAL DATA: Back pain

EXAM:
CT MYELOGRAPHY LUMBAR SPINE
TECHNIQUE: CT imaging of the lumbar spine was performed after intrathecal
contrast administration. Multiplanar CT image reconstructions were
also generated.

[Series 3: l spine soft · axial · 0.29mm/px · z∈[+88,+242]mm · 4 of 86 slices shown, 5 images]
[im 18/86  soft-tissue]
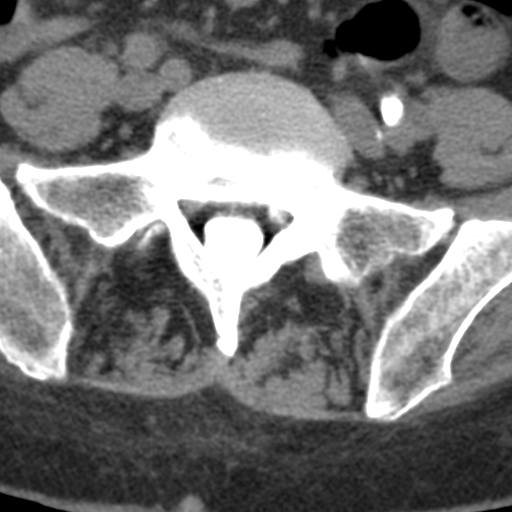
[im 18/86  bone]
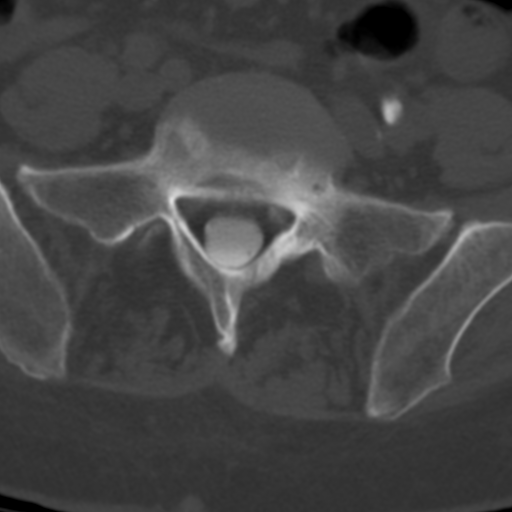
[im 35/86  bone]
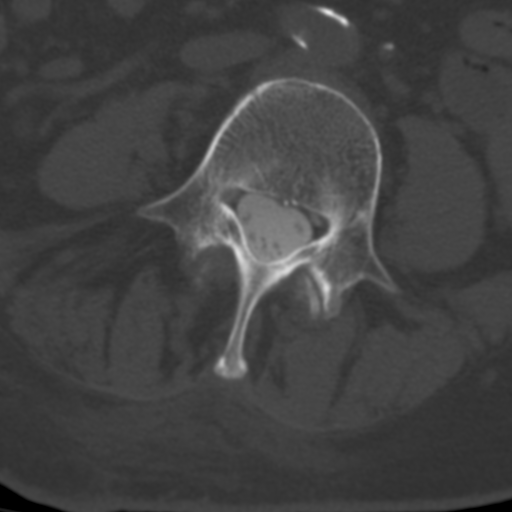
[im 52/86  bone]
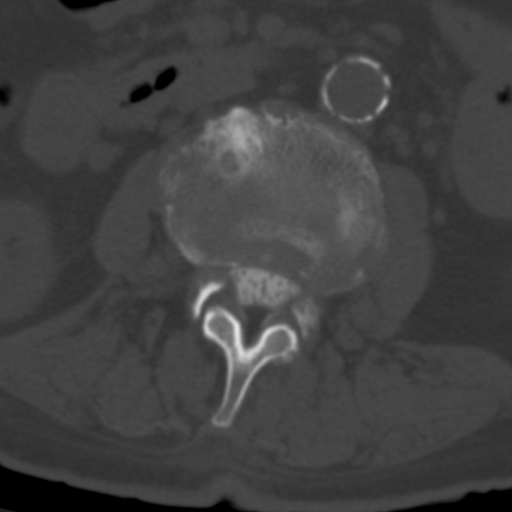
[im 69/86  bone]
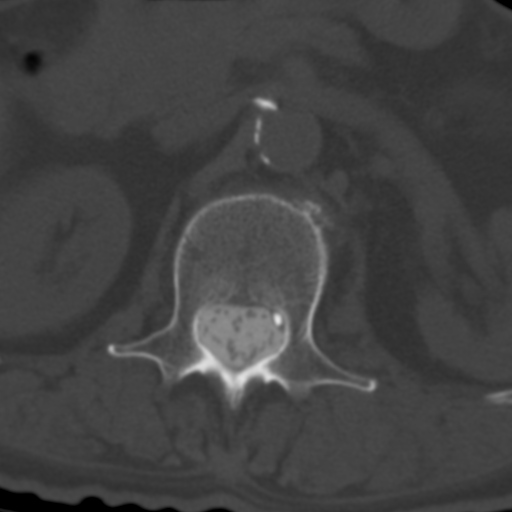

[Series 7: bone cor upper · coronal · 0.50mm/px · 1 of 36 slices shown]
[im 18/36  bone]
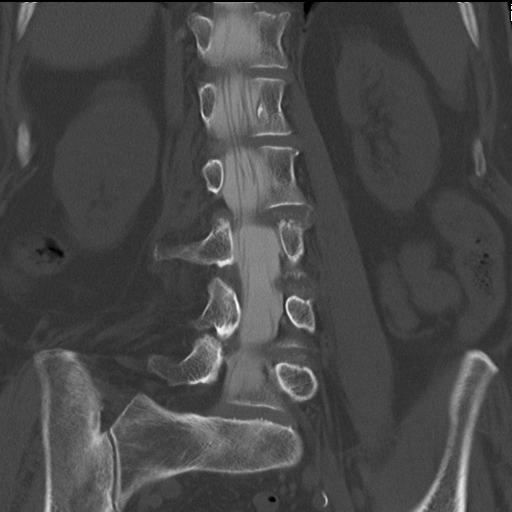

[Series 9: sag bone upper · sagittal · 0.29mm/px · 5 of 49 slices shown]
[im 9/49  bone]
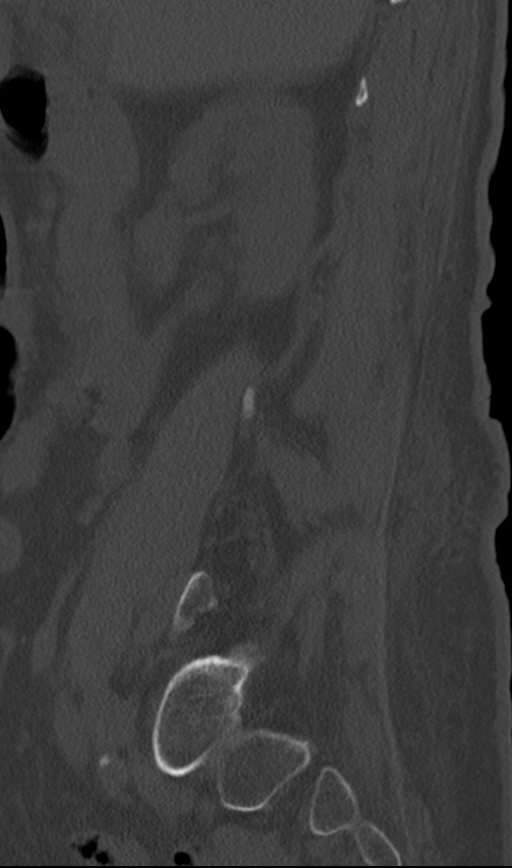
[im 17/49  bone]
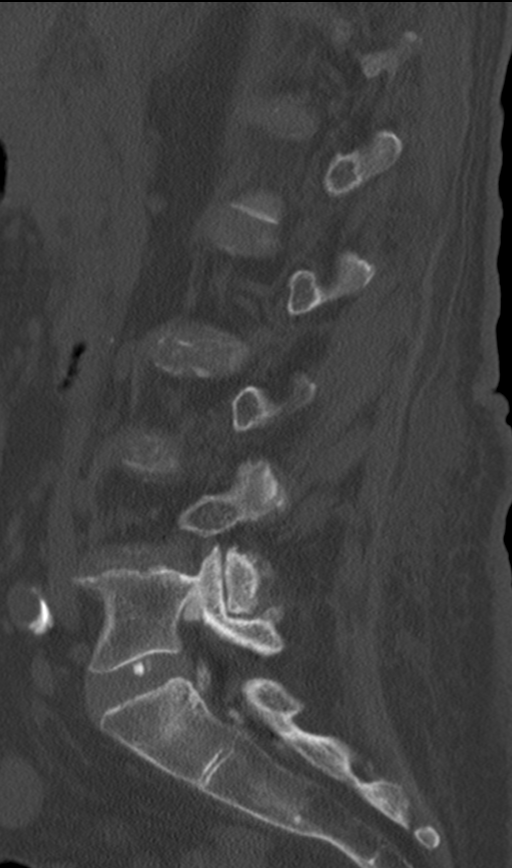
[im 25/49  bone]
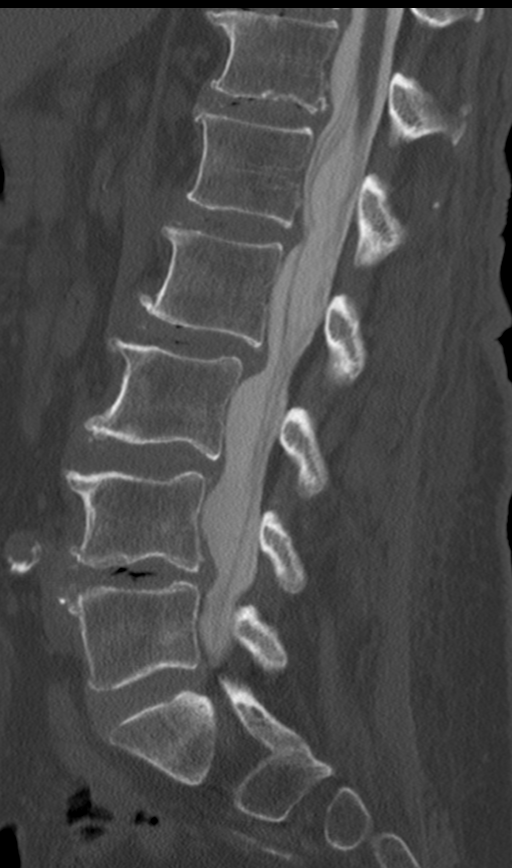
[im 33/49  bone]
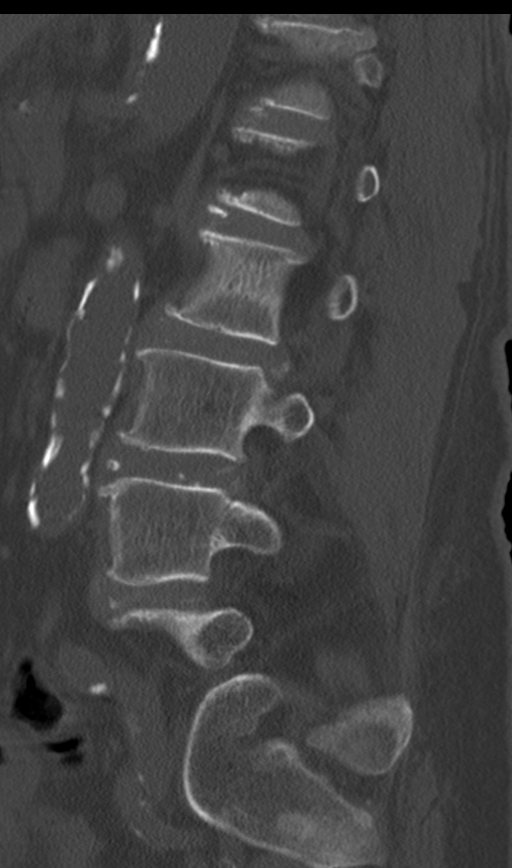
[im 41/49  bone]
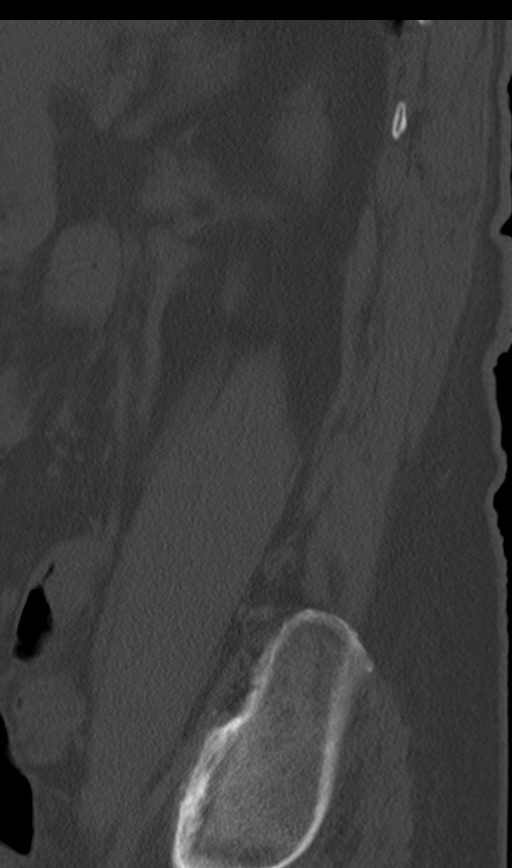

[10 of 34 positions shown; findings below may reference images not displayed]

FINDINGS: Radiologist injection

Levoscoliosis occurs at L4-5. There is some lateral translation of
L4 upon L5 towards the left. There is no vertebral compression
deformity. 5 mm retrolisthesis L2 upon L3. Conus medullaris
terminates at L1 superior endplate.

T12-L1:  Mild concentric disc bulge without stenosis.

L1-2: Very shallow left paracentral and foraminal protrusion. There
is a small epidural calcification in the left anterior epidural
space behind L1 of unknown significance. This may represent an old
chronically calcified disc fragment. It does not result in any mass
effect.

L2-3: Retrolisthesis and moderate concentric disc bulge result in
mild central stenosis and lateral recess narrowing. Foramina are
relatively patent.

L3-4: Mild concentric disc bulge. No central or foraminal stenosis.

L4-5: Moderate concentric disc bulge asymmetric to the right. This
results in significant right lateral recess narrowing and foraminal
narrowing. L4 and L5 nerve root encroachment is suspected. Mild
facet arthropathy.

L5-S1:  Unremarkable.
IMPRESSION: Mild central stenosis at L2-3.

There is significant right lateral recess and right foraminal
narrowing at L4-5 secondary to a concentric disc bulge asymmetric to
the right. Right L4 and L5 nerve root encroachment is suspected.

## 2017-06-18 DIAGNOSIS — Z0000 Encounter for general adult medical examination without abnormal findings: Secondary | ICD-10-CM | POA: Diagnosis not present

## 2017-06-18 DIAGNOSIS — E785 Hyperlipidemia, unspecified: Secondary | ICD-10-CM | POA: Diagnosis not present

## 2017-06-18 DIAGNOSIS — Z9181 History of falling: Secondary | ICD-10-CM | POA: Diagnosis not present

## 2017-06-18 DIAGNOSIS — Z139 Encounter for screening, unspecified: Secondary | ICD-10-CM | POA: Diagnosis not present

## 2017-06-18 DIAGNOSIS — Z23 Encounter for immunization: Secondary | ICD-10-CM | POA: Diagnosis not present

## 2017-06-18 DIAGNOSIS — Z1389 Encounter for screening for other disorder: Secondary | ICD-10-CM | POA: Diagnosis not present

## 2017-06-18 DIAGNOSIS — Z136 Encounter for screening for cardiovascular disorders: Secondary | ICD-10-CM | POA: Diagnosis not present

## 2017-07-09 DIAGNOSIS — I482 Chronic atrial fibrillation: Secondary | ICD-10-CM | POA: Diagnosis not present

## 2017-07-09 DIAGNOSIS — E119 Type 2 diabetes mellitus without complications: Secondary | ICD-10-CM | POA: Diagnosis not present

## 2017-07-09 DIAGNOSIS — E039 Hypothyroidism, unspecified: Secondary | ICD-10-CM | POA: Diagnosis not present

## 2017-07-09 DIAGNOSIS — E785 Hyperlipidemia, unspecified: Secondary | ICD-10-CM | POA: Diagnosis not present

## 2017-07-09 DIAGNOSIS — Z6824 Body mass index (BMI) 24.0-24.9, adult: Secondary | ICD-10-CM | POA: Diagnosis not present

## 2017-07-09 DIAGNOSIS — Z7901 Long term (current) use of anticoagulants: Secondary | ICD-10-CM | POA: Diagnosis not present

## 2017-08-27 DIAGNOSIS — I482 Chronic atrial fibrillation: Secondary | ICD-10-CM | POA: Diagnosis not present

## 2017-08-27 DIAGNOSIS — Z45010 Encounter for checking and testing of cardiac pacemaker pulse generator [battery]: Secondary | ICD-10-CM

## 2017-08-27 DIAGNOSIS — Z950 Presence of cardiac pacemaker: Secondary | ICD-10-CM

## 2017-08-27 DIAGNOSIS — I495 Sick sinus syndrome: Secondary | ICD-10-CM | POA: Diagnosis not present

## 2017-10-12 DIAGNOSIS — E785 Hyperlipidemia, unspecified: Secondary | ICD-10-CM | POA: Diagnosis not present

## 2017-10-12 DIAGNOSIS — Z23 Encounter for immunization: Secondary | ICD-10-CM | POA: Diagnosis not present

## 2017-10-12 DIAGNOSIS — E119 Type 2 diabetes mellitus without complications: Secondary | ICD-10-CM | POA: Diagnosis not present

## 2017-10-12 DIAGNOSIS — I10 Essential (primary) hypertension: Secondary | ICD-10-CM | POA: Diagnosis not present

## 2017-10-12 DIAGNOSIS — I482 Chronic atrial fibrillation: Secondary | ICD-10-CM | POA: Diagnosis not present

## 2017-10-12 DIAGNOSIS — H6123 Impacted cerumen, bilateral: Secondary | ICD-10-CM | POA: Diagnosis not present

## 2017-10-13 DIAGNOSIS — H9193 Unspecified hearing loss, bilateral: Secondary | ICD-10-CM | POA: Diagnosis not present

## 2017-10-13 DIAGNOSIS — H6123 Impacted cerumen, bilateral: Secondary | ICD-10-CM | POA: Diagnosis not present

## 2017-10-28 DIAGNOSIS — H5201 Hypermetropia, right eye: Secondary | ICD-10-CM | POA: Diagnosis not present

## 2017-10-28 DIAGNOSIS — H524 Presbyopia: Secondary | ICD-10-CM | POA: Diagnosis not present

## 2017-10-28 DIAGNOSIS — Z961 Presence of intraocular lens: Secondary | ICD-10-CM | POA: Diagnosis not present

## 2017-10-28 DIAGNOSIS — I10 Essential (primary) hypertension: Secondary | ICD-10-CM | POA: Diagnosis not present

## 2017-10-28 DIAGNOSIS — H52221 Regular astigmatism, right eye: Secondary | ICD-10-CM | POA: Diagnosis not present

## 2017-10-28 DIAGNOSIS — H35342 Macular cyst, hole, or pseudohole, left eye: Secondary | ICD-10-CM | POA: Diagnosis not present

## 2017-10-28 DIAGNOSIS — H59812 Chorioretinal scars after surgery for detachment, left eye: Secondary | ICD-10-CM | POA: Diagnosis not present

## 2017-10-28 DIAGNOSIS — H26491 Other secondary cataract, right eye: Secondary | ICD-10-CM | POA: Diagnosis not present

## 2018-01-05 DIAGNOSIS — H26491 Other secondary cataract, right eye: Secondary | ICD-10-CM | POA: Diagnosis not present

## 2018-01-05 DIAGNOSIS — Z961 Presence of intraocular lens: Secondary | ICD-10-CM | POA: Diagnosis not present

## 2018-01-05 DIAGNOSIS — Z9841 Cataract extraction status, right eye: Secondary | ICD-10-CM | POA: Diagnosis not present

## 2018-01-14 DIAGNOSIS — E119 Type 2 diabetes mellitus without complications: Secondary | ICD-10-CM | POA: Diagnosis not present

## 2018-01-14 DIAGNOSIS — R413 Other amnesia: Secondary | ICD-10-CM | POA: Diagnosis not present

## 2018-01-14 DIAGNOSIS — I482 Chronic atrial fibrillation: Secondary | ICD-10-CM | POA: Diagnosis not present

## 2018-01-14 DIAGNOSIS — Z6826 Body mass index (BMI) 26.0-26.9, adult: Secondary | ICD-10-CM | POA: Diagnosis not present

## 2018-01-14 DIAGNOSIS — G4700 Insomnia, unspecified: Secondary | ICD-10-CM | POA: Diagnosis not present

## 2018-01-14 DIAGNOSIS — E039 Hypothyroidism, unspecified: Secondary | ICD-10-CM | POA: Diagnosis not present

## 2018-01-14 DIAGNOSIS — Z1331 Encounter for screening for depression: Secondary | ICD-10-CM | POA: Diagnosis not present

## 2018-01-14 DIAGNOSIS — E785 Hyperlipidemia, unspecified: Secondary | ICD-10-CM | POA: Diagnosis not present

## 2018-02-23 DIAGNOSIS — I495 Sick sinus syndrome: Secondary | ICD-10-CM | POA: Diagnosis not present

## 2018-02-23 DIAGNOSIS — I482 Chronic atrial fibrillation: Secondary | ICD-10-CM | POA: Diagnosis not present

## 2018-02-23 DIAGNOSIS — Z950 Presence of cardiac pacemaker: Secondary | ICD-10-CM | POA: Diagnosis not present

## 2018-04-15 DIAGNOSIS — I482 Chronic atrial fibrillation: Secondary | ICD-10-CM | POA: Diagnosis not present

## 2018-04-15 DIAGNOSIS — E119 Type 2 diabetes mellitus without complications: Secondary | ICD-10-CM | POA: Diagnosis not present

## 2018-04-15 DIAGNOSIS — Z6825 Body mass index (BMI) 25.0-25.9, adult: Secondary | ICD-10-CM | POA: Diagnosis not present

## 2018-04-15 DIAGNOSIS — E039 Hypothyroidism, unspecified: Secondary | ICD-10-CM | POA: Diagnosis not present

## 2018-04-15 DIAGNOSIS — I10 Essential (primary) hypertension: Secondary | ICD-10-CM | POA: Diagnosis not present

## 2018-04-15 DIAGNOSIS — E785 Hyperlipidemia, unspecified: Secondary | ICD-10-CM | POA: Diagnosis not present

## 2018-04-16 DIAGNOSIS — R69 Illness, unspecified: Secondary | ICD-10-CM | POA: Diagnosis not present

## 2018-04-16 DIAGNOSIS — G4700 Insomnia, unspecified: Secondary | ICD-10-CM | POA: Diagnosis not present

## 2018-04-16 DIAGNOSIS — I110 Hypertensive heart disease with heart failure: Secondary | ICD-10-CM | POA: Diagnosis not present

## 2018-04-16 DIAGNOSIS — I509 Heart failure, unspecified: Secondary | ICD-10-CM | POA: Diagnosis not present

## 2018-04-16 DIAGNOSIS — I4891 Unspecified atrial fibrillation: Secondary | ICD-10-CM | POA: Diagnosis not present

## 2018-04-16 DIAGNOSIS — E876 Hypokalemia: Secondary | ICD-10-CM | POA: Diagnosis not present

## 2018-04-16 DIAGNOSIS — E039 Hypothyroidism, unspecified: Secondary | ICD-10-CM | POA: Diagnosis not present

## 2018-04-16 DIAGNOSIS — E785 Hyperlipidemia, unspecified: Secondary | ICD-10-CM | POA: Diagnosis not present

## 2018-04-16 DIAGNOSIS — H5462 Unqualified visual loss, left eye, normal vision right eye: Secondary | ICD-10-CM | POA: Diagnosis not present

## 2018-06-17 ENCOUNTER — Encounter: Payer: Self-pay | Admitting: Cardiology

## 2018-06-17 ENCOUNTER — Ambulatory Visit (INDEPENDENT_AMBULATORY_CARE_PROVIDER_SITE_OTHER): Payer: Medicare HMO | Admitting: Cardiology

## 2018-06-17 VITALS — BP 100/66 | HR 72 | Ht 66.0 in | Wt 146.0 lb

## 2018-06-17 DIAGNOSIS — E119 Type 2 diabetes mellitus without complications: Secondary | ICD-10-CM

## 2018-06-17 DIAGNOSIS — I481 Persistent atrial fibrillation: Secondary | ICD-10-CM

## 2018-06-17 DIAGNOSIS — I4819 Other persistent atrial fibrillation: Secondary | ICD-10-CM

## 2018-06-17 DIAGNOSIS — I10 Essential (primary) hypertension: Secondary | ICD-10-CM

## 2018-06-17 DIAGNOSIS — R0609 Other forms of dyspnea: Secondary | ICD-10-CM

## 2018-06-17 NOTE — Addendum Note (Signed)
Addended by: Mattie Marlin on: 06/17/2018 03:01 PM   Modules accepted: Orders

## 2018-06-17 NOTE — Progress Notes (Signed)
Cardiology Office Note:    Date:  06/17/2018   ID:  Brittany Kramer, DOB 06-27-1926, MRN [id number]  PCP:  Lowella Dandy, NP  Cardiologist:  Jenean Lindau, MD   Referring MD: Lowella Dandy, NP    ASSESSMENT:    1. Persistent atrial fibrillation ([place])   2. Essential hypertension   3. Diabetes mellitus type 2, diet-controlled ([place])    PLAN:    In order of problems listed above:  1. I discussed my findings with the patient at extensive length.I discussed with the patient atrial fibrillation, disease process. Management and therapy including rate and rhythm control, anticoagulation benefits and potential risks were discussed extensively with the patient. Patient had multiple questions which were answered to patient's satisfaction. 2. In view of her condition I will get a BNP and a BMP today.  We will also have an echocardiogram to assess his left ventricular systolic function.  She has a permanent pacemaker which is most likely at end of life.  She is not interested in the pacemaker change out.  I respect her wishes.  She will have a 48-hour Holter monitor to understand if she has any bradycardia or tachyarrhythmias. 3. Patient will be seen in follow-up appointment in 4 months or earlier if the patient has any concerns 4. She knows to go to the nearest emergency room for any significant concerns.   Medication Adjustments/Labs and Tests Ordered: Current medicines are reviewed at length with the patient today.  Concerns regarding medicines are outlined above.  No orders of the defined types were placed in this encounter.  No orders of the defined types were placed in this encounter.    Chief Complaint  Patient presents with  . Follow-up  . Edema     History of Present Illness:    Brittany Kramer is a 84 y.o. female.  The patient has past medical history of atrial fibrillation.  She is on anticoagulation.  She denies any problems at this time and takes care of activities of daily  living.  No chest pain orthopnea PND.  At the time of my evaluation, the patient is alert awake oriented and in no distress.  Her daughter mentions to me that she has some shortness of breath on exertion and gets tired easily.  This patient has been under my care in my previous practice.  He is here now to transfer his care and be established with my current practice.  EKG reveals atrial fibrillation with well-controlled ventricular rate.  Past Medical History:  Diagnosis Date  . Arrhythmia   . Atrial fibrillation ([place])   . Breast cancer ([place])   . Cancer ([place])   . CHF (congestive heart failure) ([place])   . Diabetes mellitus without complication ([place])   . Diverticulitis   . GERD (gastroesophageal reflux disease)   . Gestational diabetes, diet controlled   . Hyperlipidemia   . Hypertension   . Hypothyroidism   . Thyroid disease     Past Surgical History:  Procedure Laterality Date  . COLONOSCOPY  2004  . ESOPHAGOGASTRODUODENOSCOPY N/A 08/04/2015   Procedure: ESOPHAGOGASTRODUODENOSCOPY (EGD);  Surgeon: [name], MD;  Location: [facility] ENDOSCOPY;  Service: Endoscopy;  Laterality: N/A;  . INSERT / REPLACE / REMOVE PACEMAKER      Current Medications: Current Meds  Medication Sig  . atorvastatin (LIPITOR) 40 MG tablet Take 40 mg by mouth daily.  Marland Kitchen DILT-XR 240 MG 24 hr capsule daily.  Marland Kitchen ELIQUIS 5 MG TABS tablet TAKE 1 TABLET  TWO TIMES DAILY  . furosemide (LASIX) 40 MG tablet Take 20 mg by mouth daily.   Vanessa Kick Ethyl 1 g CAPS Take by mouth.  . levothyroxine (SYNTHROID, LEVOTHROID) 100 MCG tablet Take 100 mcg by mouth daily.  Marland Kitchen losartan-hydrochlorothiazide (HYZAAR) 50-12.5 MG per tablet Take by mouth daily.  . pantoprazole (PROTONIX) 40 MG tablet Take 1 tablet (40 mg total) by mouth daily.  . potassium chloride SA (K-DUR,KLOR-CON) 20 MEQ tablet Take 40 mEq by mouth 2 (two) times daily.   . QUEtiapine (SEROQUEL) 50 MG tablet Take 50 mg by mouth daily.   . sertraline (ZOLOFT) 100 MG tablet  Take 100 mg by mouth daily.  . [DISCONTINUED] DILT-XR 120 MG 24 hr capsule Take 120 mg by mouth daily.     Allergies:   Tramadol   Social History   Socioeconomic History  . Marital status: Married    Spouse name: Not on file  . Number of children: Not on file  . Years of education: Not on file  . Highest education level: Not on file  Occupational History  . Not on file  Social Needs  . Financial resource strain: Not on file  . Food insecurity:    Worry: Not on file    Inability: Not on file  . Transportation needs:    Medical: Not on file    Non-medical: Not on file  Tobacco Use  . Smoking status: Never Smoker  . Smokeless tobacco: Never Used  Substance and Sexual Activity  . Alcohol use: No  . Drug use: No  . Sexual activity: Not on file  Lifestyle  . Physical activity:    Days per week: Not on file    Minutes per session: Not on file  . Stress: Not on file  Relationships  . Social connections:    Talks on phone: Not on file    Gets together: Not on file    Attends religious service: Not on file    Active member of club or organization: Not on file    Attends meetings of clubs or organizations: Not on file    Relationship status: Not on file  Other Topics Concern  . Not on file  Social History Narrative  . Not on file     Family History: The patient's family history includes AAA (abdominal aortic aneurysm) in her brother and mother; Heart disease in her father.  ROS:   Please see the history of present illness.    All other systems reviewed and are negative.  EKGs/Labs/Other Studies Reviewed:    The following studies were reviewed today:  EKG reveals atrial fibrillation with well-controlled ventricular rate.   Recent Labs: No results found for requested labs within last 8760 hours.  Recent Lipid Panel No results found for: CHOL, TRIG, HDL, CHOLHDL, VLDL, LDLCALC, LDLDIRECT  Physical Exam:    VS:  BP 100/66 (BP Location: Left Arm, Patient  Position: Sitting, Cuff Size: Normal)   Pulse 72   Ht 5\' 6"  (1.676 m)   Wt 146 lb (66.2 kg)   LMP  (LMP Unknown)   SpO2 98%   BMI 23.57 kg/m     Wt Readings from Last 3 Encounters:  06/17/18 146 lb (66.2 kg)  08/04/15 153 lb (69.4 kg)     GEN: Patient is in no acute distress HEENT: Normal NECK: No JVD; No carotid bruits LYMPHATICS: No lymphadenopathy CARDIAC: Hear sounds regular, 2/6 systolic murmur at the apex. RESPIRATORY:  Clear to auscultation without rales,  wheezing or rhonchi  ABDOMEN: Soft, non-tender, non-distended MUSCULOSKELETAL:  No edema; No deformity  SKIN: Warm and dry NEUROLOGIC:  Alert and oriented x 3 PSYCHIATRIC:  Normal affect   Signed, Jenean Lindau, MD  06/17/2018 2:21 PM    [facility]

## 2018-06-17 NOTE — Patient Instructions (Signed)
Medication Instructions:  Your physician recommends that you continue on your current medications as directed. Please refer to the Current Medication list given to you today.  Labwork: Your physician recommends that you have the following labs drawn: BMP and BNP  Testing/Procedures: Your physician has requested that you have an echocardiogram. Echocardiography is a painless test that uses sound waves to create images of your heart. It provides your doctor with information about the size and shape of your heart and how well your heart's chambers and valves are working. This procedure takes approximately one hour. There are no restrictions for this procedure.  Your physician has recommended that you wear a holter monitor. Holter monitors are medical devices that record the heart's electrical activity. Doctors most often use these monitors to diagnose arrhythmias. Arrhythmias are problems with the speed or rhythm of the heartbeat. The monitor is a small, portable device. You can wear one while you do your normal daily activities. This is usually used to diagnose what is causing palpitations/syncope (passing out).  Follow-Up: Your physician recommends that you schedule a follow-up appointment in: 4 months  Any Other Special Instructions Will Be Listed Below (If Applicable).     If you need a refill on your cardiac medications before your next appointment, please call your pharmacy.   [place], RN, BSN

## 2018-06-18 ENCOUNTER — Other Ambulatory Visit: Payer: Self-pay

## 2018-06-18 DIAGNOSIS — I509 Heart failure, unspecified: Secondary | ICD-10-CM

## 2018-06-18 DIAGNOSIS — R7989 Other specified abnormal findings of blood chemistry: Secondary | ICD-10-CM

## 2018-06-18 LAB — BASIC METABOLIC PANEL
BUN / CREAT RATIO: 18 (ref 12–28)
BUN: 22 mg/dL (ref 8–27)
CHLORIDE: 102 mmol/L (ref 96–106)
CO2: 24 mmol/L (ref 20–29)
Calcium: 9.7 mg/dL (ref 8.7–10.3)
Creatinine, Ser: 1.24 mg/dL — ABNORMAL HIGH (ref 0.57–1.00)
GFR calc non Af Amer: 39 mL/min/{1.73_m2} — ABNORMAL LOW (ref >59)
GFR, EST AFRICAN AMERICAN: 45 mL/min/{1.73_m2} — AB (ref >59)
GLUCOSE: 101 mg/dL — AB (ref 65–99)
POTASSIUM: 4.6 mmol/L (ref 3.5–5.2)
SODIUM: 140 mmol/L (ref 134–144)

## 2018-06-18 LAB — PRO B NATRIURETIC PEPTIDE: NT-PRO BNP: 1384 pg/mL — AB (ref 0–738)

## 2018-06-22 DIAGNOSIS — Z0000 Encounter for general adult medical examination without abnormal findings: Secondary | ICD-10-CM | POA: Diagnosis not present

## 2018-06-22 DIAGNOSIS — Z9181 History of falling: Secondary | ICD-10-CM | POA: Diagnosis not present

## 2018-06-22 DIAGNOSIS — Z1331 Encounter for screening for depression: Secondary | ICD-10-CM | POA: Diagnosis not present

## 2018-06-22 DIAGNOSIS — Z1339 Encounter for screening examination for other mental health and behavioral disorders: Secondary | ICD-10-CM | POA: Diagnosis not present

## 2018-06-22 DIAGNOSIS — E785 Hyperlipidemia, unspecified: Secondary | ICD-10-CM | POA: Diagnosis not present

## 2018-07-13 ENCOUNTER — Telehealth: Payer: Self-pay

## 2018-07-13 ENCOUNTER — Other Ambulatory Visit: Payer: Self-pay

## 2018-07-13 DIAGNOSIS — R7989 Other specified abnormal findings of blood chemistry: Secondary | ICD-10-CM

## 2018-07-13 DIAGNOSIS — I509 Heart failure, unspecified: Secondary | ICD-10-CM

## 2018-07-13 NOTE — Telephone Encounter (Signed)
Informed patient that she is due for follow up labs, patient stated she will come tomorrow.

## 2018-07-14 DIAGNOSIS — I509 Heart failure, unspecified: Secondary | ICD-10-CM | POA: Diagnosis not present

## 2018-07-14 DIAGNOSIS — R7989 Other specified abnormal findings of blood chemistry: Secondary | ICD-10-CM | POA: Diagnosis not present

## 2018-07-15 LAB — BASIC METABOLIC PANEL
BUN/Creatinine Ratio: 19 (ref 12–28)
BUN: 22 mg/dL (ref 8–27)
CALCIUM: 9.1 mg/dL (ref 8.7–10.3)
CO2: 24 mmol/L (ref 20–29)
CREATININE: 1.14 mg/dL — AB (ref 0.57–1.00)
Chloride: 100 mmol/L (ref 96–106)
GFR, EST AFRICAN AMERICAN: 49 mL/min/{1.73_m2} — AB (ref >59)
GFR, EST NON AFRICAN AMERICAN: 43 mL/min/{1.73_m2} — AB (ref >59)
Glucose: 117 mg/dL — ABNORMAL HIGH (ref 65–99)
Potassium: 3.6 mmol/L (ref 3.5–5.2)
SODIUM: 140 mmol/L (ref 134–144)

## 2018-07-15 LAB — PRO B NATRIURETIC PEPTIDE: NT-Pro BNP: 1527 pg/mL — ABNORMAL HIGH (ref 0–738)

## 2018-07-21 ENCOUNTER — Ambulatory Visit (INDEPENDENT_AMBULATORY_CARE_PROVIDER_SITE_OTHER): Payer: Medicare HMO

## 2018-07-21 ENCOUNTER — Other Ambulatory Visit: Payer: Self-pay

## 2018-07-21 DIAGNOSIS — R0609 Other forms of dyspnea: Secondary | ICD-10-CM | POA: Diagnosis not present

## 2018-07-21 DIAGNOSIS — I481 Persistent atrial fibrillation: Secondary | ICD-10-CM | POA: Diagnosis not present

## 2018-07-21 DIAGNOSIS — I4819 Other persistent atrial fibrillation: Secondary | ICD-10-CM

## 2018-07-21 NOTE — Progress Notes (Signed)
Complete echocardiogram has been performed.  Jimmy Lev Cervone RDCS 

## 2018-07-22 DIAGNOSIS — I482 Chronic atrial fibrillation: Secondary | ICD-10-CM | POA: Diagnosis not present

## 2018-07-22 DIAGNOSIS — E785 Hyperlipidemia, unspecified: Secondary | ICD-10-CM | POA: Diagnosis not present

## 2018-07-22 DIAGNOSIS — I10 Essential (primary) hypertension: Secondary | ICD-10-CM | POA: Diagnosis not present

## 2018-07-22 DIAGNOSIS — E039 Hypothyroidism, unspecified: Secondary | ICD-10-CM | POA: Diagnosis not present

## 2018-07-22 DIAGNOSIS — E119 Type 2 diabetes mellitus without complications: Secondary | ICD-10-CM | POA: Diagnosis not present

## 2018-10-13 DIAGNOSIS — I10 Essential (primary) hypertension: Secondary | ICD-10-CM | POA: Insufficient documentation

## 2018-10-13 HISTORY — DX: Essential (primary) hypertension: I10

## 2018-10-15 DIAGNOSIS — Z6826 Body mass index (BMI) 26.0-26.9, adult: Secondary | ICD-10-CM | POA: Diagnosis not present

## 2018-10-15 DIAGNOSIS — H902 Conductive hearing loss, unspecified: Secondary | ICD-10-CM | POA: Diagnosis not present

## 2018-10-15 DIAGNOSIS — H6123 Impacted cerumen, bilateral: Secondary | ICD-10-CM | POA: Diagnosis not present

## 2018-10-18 ENCOUNTER — Ambulatory Visit (INDEPENDENT_AMBULATORY_CARE_PROVIDER_SITE_OTHER): Payer: Medicare HMO | Admitting: Cardiology

## 2018-10-18 ENCOUNTER — Encounter: Payer: Self-pay | Admitting: Cardiology

## 2018-10-18 VITALS — BP 145/66 | HR 71 | Ht 66.0 in | Wt 149.0 lb

## 2018-10-18 DIAGNOSIS — I480 Paroxysmal atrial fibrillation: Secondary | ICD-10-CM | POA: Diagnosis not present

## 2018-10-18 DIAGNOSIS — Z950 Presence of cardiac pacemaker: Secondary | ICD-10-CM | POA: Diagnosis not present

## 2018-10-18 DIAGNOSIS — I10 Essential (primary) hypertension: Secondary | ICD-10-CM

## 2018-10-18 NOTE — Patient Instructions (Signed)
Medication Instructions:  Your physician recommends that you continue on your current medications as directed. Please refer to the Current Medication list given to you today.  If you need a refill on your cardiac medications before your next appointment, please call your pharmacy.   Lab work: None  If you have labs (blood work) drawn today and your tests are completely normal, you will receive your results only by: . MyChart Message (if you have MyChart) OR . A paper copy in the mail If you have any lab test that is abnormal or we need to change your treatment, we will call you to review the results.  Testing/Procedures: None  Follow-Up: At [facility], you and your health needs are our priority.  As part of our continuing mission to provide you with exceptional heart care, we have created designated Provider Care Teams.  These Care Teams include your primary Cardiologist (physician) and Advanced Practice Providers (APPs -  Physician Assistants and Nurse Practitioners) who all work together to provide you with the care you need, when you need it.  You will need a follow up appointment in 6 months.  Please call our office 2 months in advance to schedule this appointment.  You may see another member of our [facility] Provider Team in [place]: Robert Krasowski, MD . Brian Munley, MD  Any Other Special Instructions Will Be Listed Below (If Applicable).    

## 2018-10-18 NOTE — Progress Notes (Signed)
Cardiology Office Note:    Date:  10/18/2018   ID:  Brittany Kramer, DOB 27-Mar-1926, MRN [id number]  PCP:  Lowella Dandy, NP  Cardiologist:  Jenean Lindau, MD   Referring MD: Lowella Dandy, NP    ASSESSMENT:    1. PAF (paroxysmal atrial fibrillation) ([place])   2. Essential hypertension   3. History of pacemaker    PLAN:    In order of problems listed above:  1. Primary prevention stressed with the patient.  Importance of compliance with diet and medication stressed and she vocalized understanding. 2. I discussed with the patient atrial fibrillation, disease process. Management and therapy including rate and rhythm control, anticoagulation benefits and potential risks were discussed extensively with the patient. Patient had multiple questions which were answered to patient's satisfaction. 3. We will also set up the patient with the pacemaker clinic.  She will keep a track of her heart rates especially when she feels it is going fast and send [place] a copy of those records.  She is going for blood work in the next few days with her primary care physician and her daughter promises to send [place] a copy of those records 4. 6 weeks   Medication Adjustments/Labs and Tests Ordered: Current medicines are reviewed at length with the patient today.  Concerns regarding medicines are outlined above.  Orders Placed This Encounter  Procedures  . Ambulatory referral to Cardiac Electrophysiology   No orders of the defined types were placed in this encounter.    No chief complaint on file.    History of Present Illness:    Brittany Kramer is a 84 y.o. female.  Patient has history of paroxysmal atrial fibrillation and essential hypertension.  She denies any problems at this time and takes care of activities of daily living.  No chest pain orthopnea or PND.  She ambulates age appropriately and has not had any dizzy spells or any falling down spells.  She occasionally feels her heart might be going a  little faster.  She has not checked her pulse.  Past Medical History:  Diagnosis Date  . Arrhythmia   . Atrial fibrillation (Brittany Kramer)   . Breast cancer ([place])   . Cancer ([place])   . CHF (congestive heart failure) ([place])   . Diabetes mellitus without complication ([place])   . Diverticulitis   . GERD (gastroesophageal reflux disease)   . Gestational diabetes, diet controlled   . Hyperlipidemia   . Hypertension   . Hypothyroidism   . Thyroid disease     Past Surgical History:  Procedure Laterality Date  . COLONOSCOPY  2004  . ESOPHAGOGASTRODUODENOSCOPY N/A 08/04/2015   Procedure: ESOPHAGOGASTRODUODENOSCOPY (EGD);  Surgeon: [name], MD;  Location: [facility] ENDOSCOPY;  Service: Endoscopy;  Laterality: N/A;  . INSERT / REPLACE / REMOVE PACEMAKER      Current Medications: Current Meds  Medication Sig  . atorvastatin (LIPITOR) 40 MG tablet Take 40 mg by mouth daily.  Marland Kitchen DILT-XR 240 MG 24 hr capsule Take 240 mg by mouth daily.   Marland Kitchen ELIQUIS 5 MG TABS tablet TAKE 1 TABLET TWO TIMES DAILY  . furosemide (LASIX) 40 MG tablet Take 20 mg by mouth daily.   Vanessa Kick Ethyl 1 g CAPS Take 1 g by mouth daily.   Marland Kitchen levothyroxine (SYNTHROID, LEVOTHROID) 100 MCG tablet Take 100 mcg by mouth daily.  Marland Kitchen losartan-hydrochlorothiazide (HYZAAR) 50-12.5 MG per tablet Take by mouth daily.  . pantoprazole (PROTONIX) 40 MG tablet Take 1 tablet (40  mg total) by mouth daily.  . potassium chloride SA (K-DUR,KLOR-CON) 20 MEQ tablet Take 40 mEq by mouth 2 (two) times daily.   . QUEtiapine (SEROQUEL) 50 MG tablet Take 50 mg by mouth daily.   . sertraline (ZOLOFT) 100 MG tablet Take 100 mg by mouth daily.     Allergies:   Tramadol   Social History   Socioeconomic History  . Marital status: Married    Spouse name: Not on file  . Number of children: Not on file  . Years of education: Not on file  . Highest education level: Not on file  Occupational History  . Not on file  Social Needs  . Financial resource strain: Not on  file  . Food insecurity:    Worry: Not on file    Inability: Not on file  . Transportation needs:    Medical: Not on file    Non-medical: Not on file  Tobacco Use  . Smoking status: Never Smoker  . Smokeless tobacco: Never Used  Substance and Sexual Activity  . Alcohol use: No  . Drug use: No  . Sexual activity: Not on file  Lifestyle  . Physical activity:    Days per week: Not on file    Minutes per session: Not on file  . Stress: Not on file  Relationships  . Social connections:    Talks on phone: Not on file    Gets together: Not on file    Attends religious service: Not on file    Active member of club or organization: Not on file    Attends meetings of clubs or organizations: Not on file    Relationship status: Not on file  Other Topics Concern  . Not on file  Social History Narrative  . Not on file     Family History: The patient's family history includes AAA (abdominal aortic aneurysm) in her brother and mother; Heart disease in her father.  ROS:   Please see the history of present illness.    All other systems reviewed and are negative.  EKGs/Labs/Other Studies Reviewed:    The following studies were reviewed today: I discussed my findings with the patient at extensive length.   Recent Labs: 07/14/2018: BUN 22; Creatinine, Ser 1.14; NT-Pro BNP 1,527; Potassium 3.6; Sodium 140  Recent Lipid Panel No results found for: CHOL, TRIG, HDL, CHOLHDL, VLDL, LDLCALC, LDLDIRECT  Physical Exam:    VS:  BP (!) 145/66 (BP Location: Right Arm, Patient Position: Sitting, Cuff Size: Normal)   Pulse 71   Ht 5\' 6"  (1.676 m)   Wt 149 lb (67.6 kg)   LMP  (LMP Unknown)   SpO2 98%   BMI 24.05 kg/m     Wt Readings from Last 3 Encounters:  10/18/18 149 lb (67.6 kg)  06/17/18 146 lb (66.2 kg)  08/04/15 153 lb (69.4 kg)     GEN: Patient is in no acute distress HEENT: Normal NECK: No JVD; No carotid bruits LYMPHATICS: No lymphadenopathy CARDIAC: Hear sounds regular,  2/6 systolic murmur at the apex. RESPIRATORY:  Clear to auscultation without rales, wheezing or rhonchi  ABDOMEN: Soft, non-tender, non-distended MUSCULOSKELETAL:  No edema; No deformity  SKIN: Warm and dry NEUROLOGIC:  Alert and oriented x 3 PSYCHIATRIC:  Normal affect   Signed, Jenean Lindau, MD  10/18/2018 10:29 AM    [place]

## 2018-10-26 DIAGNOSIS — I10 Essential (primary) hypertension: Secondary | ICD-10-CM | POA: Diagnosis not present

## 2018-10-26 DIAGNOSIS — E119 Type 2 diabetes mellitus without complications: Secondary | ICD-10-CM | POA: Diagnosis not present

## 2018-10-26 DIAGNOSIS — E785 Hyperlipidemia, unspecified: Secondary | ICD-10-CM | POA: Diagnosis not present

## 2018-10-26 DIAGNOSIS — E039 Hypothyroidism, unspecified: Secondary | ICD-10-CM | POA: Diagnosis not present

## 2018-10-26 DIAGNOSIS — Z23 Encounter for immunization: Secondary | ICD-10-CM | POA: Diagnosis not present

## 2018-10-26 DIAGNOSIS — Z6826 Body mass index (BMI) 26.0-26.9, adult: Secondary | ICD-10-CM | POA: Diagnosis not present

## 2018-12-20 ENCOUNTER — Encounter: Payer: Self-pay | Admitting: Cardiology

## 2018-12-20 ENCOUNTER — Ambulatory Visit (INDEPENDENT_AMBULATORY_CARE_PROVIDER_SITE_OTHER): Payer: Medicare HMO | Admitting: Cardiology

## 2018-12-20 VITALS — BP 124/60 | HR 80 | Ht 66.0 in | Wt 147.0 lb

## 2018-12-20 DIAGNOSIS — I480 Paroxysmal atrial fibrillation: Secondary | ICD-10-CM

## 2018-12-20 DIAGNOSIS — Z950 Presence of cardiac pacemaker: Secondary | ICD-10-CM

## 2018-12-20 DIAGNOSIS — I495 Sick sinus syndrome: Secondary | ICD-10-CM | POA: Diagnosis not present

## 2018-12-20 NOTE — Patient Instructions (Signed)
Medication Instructions:  Your physician recommends that you continue on your current medications as directed. Please refer to the Current Medication list given to you today.  If you need a refill on your cardiac medications before your next appointment, please call your pharmacy.   Labwork: None ordered  Testing/Procedures: None ordered  Follow-Up: Your physician wants you to follow-up in: 6 months with Dr. Camnitz.  You will receive a reminder letter in the mail two months in advance. If you don't receive a letter, please call our office to schedule the follow-up appointment.  Thank you for choosing [facility]!!   Nile Prisk, RN [phone]         

## 2018-12-20 NOTE — Progress Notes (Signed)
Electrophysiology Office Note   Date:  12/20/2018   ID:  Brittany Kramer, DOB [birth year], MRN [id number]  PCP:  Lowella Dandy, NP  Cardiologist:  Revankar Primary Electrophysiologist:  Constance Haw, MD    No chief complaint on file.    History of Present Illness: Brittany Kramer is a 85 y.o. female who is being seen today for the evaluation of atrial fibrillation at the request of Brittany Kramer. Presenting today for electrophysiology evaluation.  She has a history of paroxysmal, hypertension, hyperlipidemia.  She also has a Biotronik pacemaker implanted.  The battery on her Biotronik pacemaker has died.  She is not pacing currently.  She does have atrial fibrillation and she is in atrial fibrillation today.  She is minimally symptomatic.  She does say that there are times that she gets weak and fatigued and notes that her heart rate is fast.  She has not checked her pulse at this time.    Today, she denies symptoms of palpitations, chest pain, shortness of breath, orthopnea, PND, lower extremity edema, claudication, dizziness, presyncope, syncope, bleeding, or neurologic sequela. The patient is tolerating medications without difficulties.    Past Medical History:  Diagnosis Date  . Arrhythmia   . Atrial fibrillation ([place])   . Breast cancer ([place])   . Cancer ([place])   . CHF (congestive heart failure) ([place])   . Diabetes mellitus without complication ([place])   . Diverticulitis   . GERD (gastroesophageal reflux disease)   . Gestational diabetes, diet controlled   . Hyperlipidemia   . Hypertension   . Hypothyroidism   . Thyroid disease    Past Surgical History:  Procedure Laterality Date  . COLONOSCOPY  2004  . ESOPHAGOGASTRODUODENOSCOPY N/A 08/04/2015   Procedure: ESOPHAGOGASTRODUODENOSCOPY (EGD);  Surgeon: [name], MD;  Location: [facility] ENDOSCOPY;  Service: Endoscopy;  Laterality: N/A;  . INSERT / REPLACE / REMOVE PACEMAKER       Current Outpatient Medications  Medication  Sig Dispense Refill  . atorvastatin (LIPITOR) 40 MG tablet Take 40 mg by mouth daily.    Marland Kitchen DILT-XR 240 MG 24 hr capsule Take 240 mg by mouth daily.     Marland Kitchen ELIQUIS 5 MG TABS tablet TAKE 1 TABLET TWO TIMES DAILY  6  . furosemide (LASIX) 40 MG tablet Take 20 mg by mouth daily.     Vanessa Kick Ethyl 1 g CAPS Take 1 g by mouth daily.     Marland Kitchen levothyroxine (SYNTHROID, LEVOTHROID) 100 MCG tablet Take 100 mcg by mouth daily.    Marland Kitchen losartan-hydrochlorothiazide (HYZAAR) 50-12.5 MG per tablet Take by mouth daily.    . pantoprazole (PROTONIX) 40 MG tablet Take 1 tablet (40 mg total) by mouth daily. 30 tablet 2  . potassium chloride SA (K-DUR,KLOR-CON) 20 MEQ tablet Take 40 mEq by mouth 2 (two) times daily.     . QUEtiapine (SEROQUEL) 50 MG tablet Take 50 mg by mouth daily.     . sertraline (ZOLOFT) 100 MG tablet Take 100 mg by mouth daily.     No current facility-administered medications for this visit.     Allergies:   Tramadol   Social History:  The patient  reports that she has never smoked. She has never used smokeless tobacco. She reports that she does not drink alcohol or use drugs.   Family History:  The patient's family history includes AAA (abdominal aortic aneurysm) in her brother and mother; Heart disease in her father.    ROS:  Please see the  history of present illness.   Otherwise, review of systems is positive for none.   All other systems are reviewed and negative.    PHYSICAL EXAM: VS:  BP 124/60   Pulse 80   Ht 5\' 6"  (1.676 m)   Wt 147 lb (66.7 kg)   LMP  (LMP Unknown)   BMI 23.73 kg/m  , BMI Body mass index is 23.73 kg/m. GEN: Well nourished, well developed, in no acute distress  HEENT: normal  Neck: no JVD, carotid bruits, or masses Cardiac: iRRR; no murmurs, rubs, or gallops,no edema  Respiratory:  clear to auscultation bilaterally, normal work of breathing GI: soft, nontender, nondistended, + BS MS: no deformity or atrophy  Skin: warm and dry, device pocket is well  healed Neuro:  Strength and sensation are intact Psych: euthymic mood, full affect  EKG:  EKG is ordered today. Personal review of the ekg ordered shows atrial fibrillation, rate 80  Device interrogation is reviewed today in detail.  See PaceArt for details.   Recent Labs: 07/14/2018: BUN 22; Creatinine, Ser 1.14; NT-Pro BNP 1,527; Potassium 3.6; Sodium 140    Lipid Panel  No results found for: CHOL, TRIG, HDL, CHOLHDL, VLDL, LDLCALC, LDLDIRECT   Wt Readings from Last 3 Encounters:  12/20/18 147 lb (66.7 kg)  10/18/18 149 lb (67.6 kg)  06/17/18 146 lb (66.2 kg)      Other studies Reviewed: Additional studies/ records that were reviewed today include: TTE 07/21/18  Review of the above records today demonstrates:  - Left ventricle: The cavity size was normal. Wall thickness was   increased in a pattern of mild LVH. Systolic function was normal.   The estimated ejection fraction was in the range of 50% to 55%.   Wall motion was normal; there were no regional wall motion   abnormalities. - Aortic valve: There was mild regurgitation. - Mitral valve: Mildly calcified annulus. Mildly thickened leaflets   . There was mild to moderate regurgitation. - Left atrium: The atrium was moderately dilated. - Right atrium: The atrium was moderately dilated. - Tricuspid valve: There was mild-moderate regurgitation. - Pulmonary arteries: PA peak pressure: 34 mm Hg (S).  Holter 07/21/18 - personally reviewed Baseline rhythm: Atrial fibrillation Minimum heart rate: 40 BPM.  Average heart rate: 81 BPM.  Maximal heart rate 142 BPM. Atrial arrhythmia: Patient remained in atrial fibrillation throughout the study Ventricular arrhythmia: None significant rare PVCs and couplets were seen.   ASSESSMENT AND PLAN:  1.  Sick sinus syndrome: Status post Biotronik dual-chamber pacemaker.  The device battery is now dead and cannot be interrogated.  She is in atrial fibrillation now and from what I can  tell has no pacing indication.  She does not wish to have a generator change at this time.  She does have significant bradycardia due to medications for atrial fibrillation, would plan for generator change.  2.  Permanent atrial fibrillation: It is been difficult to tell how long she has been atrial fibrillation, though she is minimally symptomatic.  I  not make any further efforts to get her back into normal rhythm.  She does say that she has sometimes where she is tachycardic.  I have asked her to get a pulse oximeter to check an accurate pulse to see what medication changes need to be done.  This patients CHA2DS2-VASc Score and unadjusted Ischemic Stroke Rate (% per year) is equal to 4.8 % stroke rate/year from a score of 4  Above score calculated as 1  point each if present [CHF, HTN, DM, Vascular=MI/PAD/Aortic Plaque, Age if 65-74, or Female] Above score calculated as 2 points each if present [Age > 75, or Stroke/TIA/TE]    Current medicines are reviewed at length with the patient today.   The patient does not have concerns regarding her medicines.  The following changes were made today:  none  Labs/ tests ordered today include:  Orders Placed This Encounter  Procedures  . EKG 12-Lead   Case discussed with primary cardiology  Disposition:   FU with   6 months  Signed,  Meredith Leeds, MD  12/20/2018 2:37 PM     [place] [address] [place] [address] [phone] (office) [phone] (fax)

## 2019-06-27 DIAGNOSIS — Z9181 History of falling: Secondary | ICD-10-CM | POA: Diagnosis not present

## 2019-06-27 DIAGNOSIS — E785 Hyperlipidemia, unspecified: Secondary | ICD-10-CM | POA: Diagnosis not present

## 2019-06-27 DIAGNOSIS — Z594 Lack of adequate food and safe drinking water: Secondary | ICD-10-CM | POA: Diagnosis not present

## 2019-06-27 DIAGNOSIS — Z0000 Encounter for general adult medical examination without abnormal findings: Secondary | ICD-10-CM | POA: Diagnosis not present

## 2019-06-27 DIAGNOSIS — Z1331 Encounter for screening for depression: Secondary | ICD-10-CM | POA: Diagnosis not present

## 2019-06-27 DIAGNOSIS — Z136 Encounter for screening for cardiovascular disorders: Secondary | ICD-10-CM | POA: Diagnosis not present

## 2019-06-27 DIAGNOSIS — Z139 Encounter for screening, unspecified: Secondary | ICD-10-CM | POA: Diagnosis not present

## 2019-07-07 DIAGNOSIS — E119 Type 2 diabetes mellitus without complications: Secondary | ICD-10-CM | POA: Diagnosis not present

## 2019-07-07 DIAGNOSIS — E785 Hyperlipidemia, unspecified: Secondary | ICD-10-CM | POA: Diagnosis not present

## 2019-07-07 DIAGNOSIS — E039 Hypothyroidism, unspecified: Secondary | ICD-10-CM | POA: Diagnosis not present

## 2019-07-07 DIAGNOSIS — Z6825 Body mass index (BMI) 25.0-25.9, adult: Secondary | ICD-10-CM | POA: Diagnosis not present

## 2019-07-07 DIAGNOSIS — I10 Essential (primary) hypertension: Secondary | ICD-10-CM | POA: Diagnosis not present

## 2019-07-07 DIAGNOSIS — I482 Chronic atrial fibrillation, unspecified: Secondary | ICD-10-CM | POA: Diagnosis not present

## 2019-07-22 DIAGNOSIS — Z6824 Body mass index (BMI) 24.0-24.9, adult: Secondary | ICD-10-CM | POA: Diagnosis not present

## 2019-07-22 DIAGNOSIS — H6123 Impacted cerumen, bilateral: Secondary | ICD-10-CM | POA: Diagnosis not present

## 2019-07-22 DIAGNOSIS — H900 Conductive hearing loss, bilateral: Secondary | ICD-10-CM | POA: Diagnosis not present

## 2019-07-25 DIAGNOSIS — Z6824 Body mass index (BMI) 24.0-24.9, adult: Secondary | ICD-10-CM | POA: Diagnosis not present

## 2019-07-25 DIAGNOSIS — H6123 Impacted cerumen, bilateral: Secondary | ICD-10-CM | POA: Diagnosis not present

## 2019-08-01 DIAGNOSIS — R000 Tachycardia, unspecified: Secondary | ICD-10-CM | POA: Diagnosis not present

## 2019-08-01 DIAGNOSIS — R52 Pain, unspecified: Secondary | ICD-10-CM | POA: Diagnosis not present

## 2019-08-01 DIAGNOSIS — W1830XA Fall on same level, unspecified, initial encounter: Secondary | ICD-10-CM | POA: Diagnosis not present

## 2019-08-01 DIAGNOSIS — R51 Headache: Secondary | ICD-10-CM | POA: Diagnosis not present

## 2019-08-01 DIAGNOSIS — I4891 Unspecified atrial fibrillation: Secondary | ICD-10-CM | POA: Diagnosis not present

## 2019-08-01 DIAGNOSIS — R42 Dizziness and giddiness: Secondary | ICD-10-CM | POA: Diagnosis not present

## 2019-08-03 DIAGNOSIS — S0101XA Laceration without foreign body of scalp, initial encounter: Secondary | ICD-10-CM | POA: Diagnosis not present

## 2019-08-03 DIAGNOSIS — S199XXA Unspecified injury of neck, initial encounter: Secondary | ICD-10-CM | POA: Diagnosis not present

## 2019-08-03 DIAGNOSIS — N390 Urinary tract infection, site not specified: Secondary | ICD-10-CM | POA: Diagnosis not present

## 2019-08-03 DIAGNOSIS — M25551 Pain in right hip: Secondary | ICD-10-CM | POA: Diagnosis not present

## 2019-08-03 DIAGNOSIS — W19XXXA Unspecified fall, initial encounter: Secondary | ICD-10-CM | POA: Diagnosis not present

## 2019-08-03 DIAGNOSIS — I4891 Unspecified atrial fibrillation: Secondary | ICD-10-CM | POA: Diagnosis not present

## 2019-08-03 DIAGNOSIS — S299XXA Unspecified injury of thorax, initial encounter: Secondary | ICD-10-CM | POA: Diagnosis not present

## 2019-08-03 DIAGNOSIS — S79911A Unspecified injury of right hip, initial encounter: Secondary | ICD-10-CM | POA: Diagnosis not present

## 2019-08-03 DIAGNOSIS — S0990XA Unspecified injury of head, initial encounter: Secondary | ICD-10-CM | POA: Diagnosis not present

## 2019-08-03 DIAGNOSIS — M5030 Other cervical disc degeneration, unspecified cervical region: Secondary | ICD-10-CM | POA: Diagnosis not present

## 2019-10-07 DIAGNOSIS — E039 Hypothyroidism, unspecified: Secondary | ICD-10-CM | POA: Diagnosis not present

## 2019-10-07 DIAGNOSIS — E119 Type 2 diabetes mellitus without complications: Secondary | ICD-10-CM | POA: Diagnosis not present

## 2019-10-07 DIAGNOSIS — I10 Essential (primary) hypertension: Secondary | ICD-10-CM | POA: Diagnosis not present

## 2019-10-07 DIAGNOSIS — Z23 Encounter for immunization: Secondary | ICD-10-CM | POA: Diagnosis not present

## 2019-10-07 DIAGNOSIS — I482 Chronic atrial fibrillation, unspecified: Secondary | ICD-10-CM | POA: Diagnosis not present

## 2019-10-07 DIAGNOSIS — R69 Illness, unspecified: Secondary | ICD-10-CM | POA: Diagnosis not present

## 2019-10-07 DIAGNOSIS — R413 Other amnesia: Secondary | ICD-10-CM | POA: Diagnosis not present

## 2019-10-07 DIAGNOSIS — E785 Hyperlipidemia, unspecified: Secondary | ICD-10-CM | POA: Diagnosis not present

## 2019-10-07 DIAGNOSIS — Z6824 Body mass index (BMI) 24.0-24.9, adult: Secondary | ICD-10-CM | POA: Diagnosis not present

## 2020-07-03 DIAGNOSIS — E785 Hyperlipidemia, unspecified: Secondary | ICD-10-CM | POA: Diagnosis not present

## 2020-07-03 DIAGNOSIS — Z139 Encounter for screening, unspecified: Secondary | ICD-10-CM | POA: Diagnosis not present

## 2020-07-03 DIAGNOSIS — Z1331 Encounter for screening for depression: Secondary | ICD-10-CM | POA: Diagnosis not present

## 2020-07-03 DIAGNOSIS — Z9181 History of falling: Secondary | ICD-10-CM | POA: Diagnosis not present

## 2020-07-03 DIAGNOSIS — Z0000 Encounter for general adult medical examination without abnormal findings: Secondary | ICD-10-CM | POA: Diagnosis not present

## 2020-12-11 DIAGNOSIS — Z20822 Contact with and (suspected) exposure to covid-19: Secondary | ICD-10-CM | POA: Diagnosis not present

## 2021-01-08 DIAGNOSIS — Z23 Encounter for immunization: Secondary | ICD-10-CM | POA: Diagnosis not present

## 2021-01-08 DIAGNOSIS — I10 Essential (primary) hypertension: Secondary | ICD-10-CM | POA: Diagnosis not present

## 2021-01-08 DIAGNOSIS — E785 Hyperlipidemia, unspecified: Secondary | ICD-10-CM | POA: Diagnosis not present

## 2021-01-08 DIAGNOSIS — R69 Illness, unspecified: Secondary | ICD-10-CM | POA: Diagnosis not present

## 2021-01-08 DIAGNOSIS — E1169 Type 2 diabetes mellitus with other specified complication: Secondary | ICD-10-CM | POA: Diagnosis not present

## 2021-01-08 DIAGNOSIS — I482 Chronic atrial fibrillation, unspecified: Secondary | ICD-10-CM | POA: Diagnosis not present

## 2021-01-08 DIAGNOSIS — R413 Other amnesia: Secondary | ICD-10-CM | POA: Diagnosis not present

## 2021-01-08 DIAGNOSIS — Z6824 Body mass index (BMI) 24.0-24.9, adult: Secondary | ICD-10-CM | POA: Diagnosis not present

## 2021-01-08 DIAGNOSIS — E039 Hypothyroidism, unspecified: Secondary | ICD-10-CM | POA: Diagnosis not present

## 2021-01-08 DIAGNOSIS — H6123 Impacted cerumen, bilateral: Secondary | ICD-10-CM | POA: Diagnosis not present

## 2021-01-14 DIAGNOSIS — H900 Conductive hearing loss, bilateral: Secondary | ICD-10-CM | POA: Diagnosis not present

## 2021-01-14 DIAGNOSIS — Z6825 Body mass index (BMI) 25.0-25.9, adult: Secondary | ICD-10-CM | POA: Diagnosis not present

## 2021-01-14 DIAGNOSIS — H6123 Impacted cerumen, bilateral: Secondary | ICD-10-CM | POA: Diagnosis not present

## 2021-03-01 DIAGNOSIS — I10 Essential (primary) hypertension: Secondary | ICD-10-CM | POA: Insufficient documentation

## 2021-03-01 DIAGNOSIS — C801 Malignant (primary) neoplasm, unspecified: Secondary | ICD-10-CM | POA: Insufficient documentation

## 2021-03-01 DIAGNOSIS — I499 Cardiac arrhythmia, unspecified: Secondary | ICD-10-CM | POA: Insufficient documentation

## 2021-03-01 DIAGNOSIS — E079 Disorder of thyroid, unspecified: Secondary | ICD-10-CM | POA: Insufficient documentation

## 2021-03-01 DIAGNOSIS — K5792 Diverticulitis of intestine, part unspecified, without perforation or abscess without bleeding: Secondary | ICD-10-CM | POA: Insufficient documentation

## 2021-03-01 DIAGNOSIS — C50919 Malignant neoplasm of unspecified site of unspecified female breast: Secondary | ICD-10-CM | POA: Insufficient documentation

## 2021-03-01 DIAGNOSIS — O24410 Gestational diabetes mellitus in pregnancy, diet controlled: Secondary | ICD-10-CM | POA: Insufficient documentation

## 2021-03-01 DIAGNOSIS — E119 Type 2 diabetes mellitus without complications: Secondary | ICD-10-CM | POA: Insufficient documentation

## 2021-03-01 DIAGNOSIS — E785 Hyperlipidemia, unspecified: Secondary | ICD-10-CM | POA: Insufficient documentation

## 2021-03-04 ENCOUNTER — Encounter: Payer: Self-pay | Admitting: Cardiology

## 2021-03-04 ENCOUNTER — Ambulatory Visit (INDEPENDENT_AMBULATORY_CARE_PROVIDER_SITE_OTHER): Payer: Medicare HMO | Admitting: Cardiology

## 2021-03-04 ENCOUNTER — Other Ambulatory Visit: Payer: Self-pay

## 2021-03-04 VITALS — BP 130/76 | HR 108 | Ht 63.0 in | Wt 148.4 lb

## 2021-03-04 DIAGNOSIS — Z950 Presence of cardiac pacemaker: Secondary | ICD-10-CM | POA: Diagnosis not present

## 2021-03-04 DIAGNOSIS — I10 Essential (primary) hypertension: Secondary | ICD-10-CM | POA: Diagnosis not present

## 2021-03-04 DIAGNOSIS — I4821 Permanent atrial fibrillation: Secondary | ICD-10-CM | POA: Diagnosis not present

## 2021-03-04 HISTORY — DX: Permanent atrial fibrillation: I48.21

## 2021-03-04 NOTE — Patient Instructions (Signed)
Medication Instructions:  No medication changes. *If you need a refill on your cardiac medications before your next appointment, please call your pharmacy*   Lab Work: None ordered If you have labs (blood work) drawn today and your tests are completely normal, you will receive your results only by: MyChart Message (if you have MyChart) OR A paper copy in the mail If you have any lab test that is abnormal or we need to change your treatment, we will call you to review the results.   Testing/Procedures: Your physician has requested that you have an echocardiogram. Echocardiography is a painless test that uses sound waves to create images of your heart. It provides your doctor with information about the size and shape of your heart and how well your heart's chambers and valves are working. This procedure takes approximately one hour. There are no restrictions for this procedure.    Follow-Up: At [facility], you and your health needs are our priority.  As part of our continuing mission to provide you with exceptional heart care, we have created designated Provider Care Teams.  These Care Teams include your primary Cardiologist (physician) and Advanced Practice Providers (APPs -  Physician Assistants and Nurse Practitioners) who all work together to provide you with the care you need, when you need it.  We recommend signing up for the patient portal called "MyChart".  Sign up information is provided on this After Visit Summary.  MyChart is used to connect with patients for Virtual Visits (Telemedicine).  Patients are able to view lab/test results, encounter notes, upcoming appointments, etc.  Non-urgent messages can be sent to your provider as well.   To learn more about what you can do with MyChart, go to [email or web address].    Your next appointment:   6 month(s)  The format for your next appointment:   In Person  Provider:   Rajan Revankar, MD   Other  Instructions Echocardiogram An echocardiogram is a test that uses sound waves (ultrasound) to produce images of the heart. Images from an echocardiogram can provide important information about: Heart size and shape. The size and thickness and movement of your heart's walls. Heart muscle function and strength. Heart valve function or if you have stenosis. Stenosis is when the heart valves are too narrow. If blood is flowing backward through the heart valves (regurgitation). A tumor or infectious growth around the heart valves. Areas of heart muscle that are not working well because of poor blood flow or injury from a heart attack. Aneurysm detection. An aneurysm is a weak or damaged part of an artery wall. The wall bulges out from the normal force of blood pumping through the body. Tell a health care provider about: Any allergies you have. All medicines you are taking, including vitamins, herbs, eye drops, creams, and over-the-counter medicines. Any blood disorders you have. Any surgeries you have had. Any medical conditions you have. Whether you are pregnant or may be pregnant. What are the risks? Generally, this is a safe test. However, problems may occur, including an allergic reaction to dye (contrast) that may be used during the test. What happens before the test? No specific preparation is needed. You may eat and drink normally. What happens during the test? You will take off your clothes from the waist up and put on a hospital gown. Electrodes or electrocardiogram (ECG)patches may be placed on your chest. The electrodes or patches are then connected to a device that monitors your heart rate and rhythm. You will   lie down on a table for an ultrasound exam. A gel will be applied to your chest to help sound waves pass through your skin. A handheld device, called a transducer, will be pressed against your chest and moved over your heart. The transducer produces sound waves that travel to  your heart and bounce back (or "echo" back) to the transducer. These sound waves will be captured in real-time and changed into images of your heart that can be viewed on a video monitor. The images will be recorded on a computer and reviewed by your health care provider. You may be asked to change positions or hold your breath for a short time. This makes it easier to get different views or better views of your heart. In some cases, you may receive contrast through an IV in one of your veins. This can improve the quality of the pictures from your heart. The procedure may vary among health care providers and hospitals.   What can I expect after the test? You may return to your normal, everyday life, including diet, activities, and medicines, unless your health care provider tells you not to do that. Follow these instructions at home: It is up to you to get the results of your test. Ask your health care provider, or the department that is doing the test, when your results will be ready. Keep all follow-up visits. This is important. Summary An echocardiogram is a test that uses sound waves (ultrasound) to produce images of the heart. Images from an echocardiogram can provide important information about the size and shape of your heart, heart muscle function, heart valve function, and other possible heart problems. You do not need to do anything to prepare before this test. You may eat and drink normally. After the echocardiogram is completed, you may return to your normal, everyday life, unless your health care provider tells you not to do that. This information is not intended to replace advice given to you by your health care provider. Make sure you discuss any questions you have with your health care provider. Document Revised: 07/10/2020 Document Reviewed: 07/10/2020 Elsevier Patient Education  2021 [employer]   

## 2021-03-04 NOTE — Progress Notes (Signed)
Cardiology Office Note:    Date:  03/04/2021   ID:  Brittany Kramer, DOB Feb 21, 1926, MRN [id number]  PCP:  Lowella Dandy, NP  Cardiologist:  Jenean Lindau, MD   Referring MD: Lowella Dandy, NP    ASSESSMENT:    1. History of pacemaker   2. Permanent atrial fibrillation ([place])   3. Benign essential hypertension    PLAN:    In order of problems listed above:  1. Primary prevention stressed with the patient.  Importance of compliance with diet medication stressed and she vocalized understanding.  I advised her to ambulate age appropriately and she is agreeable. 2. Essential hypertension: Blood pressure stable and diet was emphasized. 3. Atrial fibrillation: Permanent:I discussed with the patient atrial fibrillation, disease process. Management and therapy including rate and rhythm control, anticoagulation benefits and potential risks were discussed extensively with the patient. Patient had multiple questions which were answered to patient's satisfaction. 4. Patient and family had multiple questions which were answered to satisfaction. 5. Patient will be seen in follow-up appointment in 6 months or earlier if the patient has any concerns    Medication Adjustments/Labs and Tests Ordered: Current medicines are reviewed at length with the patient today.  Concerns regarding medicines are outlined above.  Orders Placed This Encounter  Procedures  . EKG 12-Lead  . ECHOCARDIOGRAM COMPLETE   No orders of the defined types were placed in this encounter.    No chief complaint on file.    History of Present Illness:    Brittany Kramer is a 87 y.o. female patient.  Patient has past medical history of permanent atrial fibrillation.  She has a pacemaker which her electrophysiology colleagues have evaluated in the past and the battery has died.  She denies any chest pain orthopnea or PND.  She has history of essential hypertension.  At the time of my evaluation, the patient is alert awake  oriented and in no distress.  She comes in accompanied by her daughter.  Past Medical History:  Diagnosis Date  . Acute blood loss anemia 08/04/2015  . Acute GI bleeding 08/04/2015  . Arrhythmia   . Atrial fibrillation ([place])   . Benign essential hypertension 10/13/2018  . Breast cancer ([place])   . Cancer ([place])   . CHF (congestive heart failure) ([place])   . Diabetes mellitus without complication ([place])   . Diverticulitis   . Diverticulosis 08/04/2015  . GERD (gastroesophageal reflux disease)   . Gestational diabetes, diet controlled   . History of pacemaker 08/04/2015  . HLD (hyperlipidemia) 08/04/2015  . HTN (hypertension) 08/04/2015  . Hyperlipidemia   . Hypertension   . Hypothyroidism   . Iron deficiency anemia 08/04/2015  . SSS (sick sinus syndrome) ([place]) 08/14/2016  . Thyroid disease     Past Surgical History:  Procedure Laterality Date  . COLONOSCOPY  2004  . ESOPHAGOGASTRODUODENOSCOPY N/A 08/04/2015   Procedure: ESOPHAGOGASTRODUODENOSCOPY (EGD);  Surgeon: [name], MD;  [facility]COPY;  Service: Endoscopy;  Laterality: N/A;  . INSERT / REPLACE / REMOVE PACEMAKER      Current Medications: Current Meds  Medication Sig  . apixaban (ELIQUIS) 5 MG TABS tablet Take 5 mg by mouth 2 (two) times daily.  Marland Kitchen atorvastatin (LIPITOR) 40 MG tablet Take 40 mg by mouth daily.  Marland Kitchen DILT-XR 240 MG 24 hr capsule Take 240 mg by mouth daily.   . furosemide (LASIX) 40 MG tablet Take 20 mg by mouth daily.   Marland Kitchen levothyroxine (SYNTHROID, LEVOTHROID) 100 MCG  tablet Take 100 mcg by mouth daily.  Marland Kitchen losartan-hydrochlorothiazide (HYZAAR) 50-12.5 MG tablet Take 1 tablet by mouth daily.  . pantoprazole (PROTONIX) 40 MG tablet Take 1 tablet (40 mg total) by mouth daily.  . potassium chloride SA (K-DUR,KLOR-CON) 20 MEQ tablet Take 40 mEq by mouth 2 (two) times daily.   . QUEtiapine (SEROQUEL) 50 MG tablet Take 50 mg by mouth daily.   . sertraline (ZOLOFT) 100 MG tablet Take 100 mg by mouth daily.      Allergies:   Tramadol   Social History   Socioeconomic History  . Marital status: Married    Spouse name: Not on file  . Number of children: Not on file  . Years of education: Not on file  . Highest education level: Not on file  Occupational History  . Not on file  Tobacco Use  . Smoking status: Never Smoker  . Smokeless tobacco: Never Used  Substance and Sexual Activity  . Alcohol use: No  . Drug use: No  . Sexual activity: Not on file  Other Topics Concern  . Not on file  Social History Narrative  . Not on file   Social Determinants of Health   Financial Resource Strain: Not on file  Food Insecurity: Not on file  Transportation Needs: Not on file  Physical Activity: Not on file  Stress: Not on file  Social Connections: Not on file     Family History: The patient's family history includes AAA (abdominal aortic aneurysm) in her brother and mother; Heart disease in her father.  ROS:   Please see the history of present illness.    All other systems reviewed and are negative.  EKGs/Labs/Other Studies Reviewed:    The following studies were reviewed today: EKG reveals atrial fibrillation with well-controlled ventricular rate.   Recent Labs: No results found for requested labs within last 8760 hours.  Recent Lipid Panel No results found for: CHOL, TRIG, HDL, CHOLHDL, VLDL, LDLCALC, LDLDIRECT  Physical Exam:    VS:  BP 130/76   Pulse (!) 108   Ht 5\' 3"  (1.6 m)   Wt 148 lb 6.4 oz (67.3 kg)   LMP  (LMP Unknown)   SpO2 95%   BMI 26.29 kg/m     Wt Readings from Last 3 Encounters:  03/04/21 148 lb 6.4 oz (67.3 kg)  12/20/18 147 lb (66.7 kg)  10/18/18 149 lb (67.6 kg)     GEN: Patient is in no acute distress HEENT: Normal NECK: No JVD; No carotid bruits LYMPHATICS: No lymphadenopathy CARDIAC: Hear sounds irregular, 2/6 systolic murmur at the apex. RESPIRATORY:  Clear to auscultation without rales, wheezing or rhonchi  ABDOMEN: Soft, non-tender,  non-distended MUSCULOSKELETAL:  No edema; No deformity  SKIN: Warm and dry NEUROLOGIC:  Alert and oriented x 3 PSYCHIATRIC:  Normal affect   Signed, Jenean Lindau, MD  03/04/2021 3:39 PM    [facility]

## 2021-03-28 ENCOUNTER — Ambulatory Visit (INDEPENDENT_AMBULATORY_CARE_PROVIDER_SITE_OTHER): Payer: Medicare HMO

## 2021-03-28 ENCOUNTER — Other Ambulatory Visit: Payer: Self-pay

## 2021-03-28 DIAGNOSIS — I4821 Permanent atrial fibrillation: Secondary | ICD-10-CM | POA: Diagnosis not present

## 2021-03-28 LAB — ECHOCARDIOGRAM COMPLETE
Area-P 1/2: 4.6 cm2
Calc EF: 33.4 %
P 1/2 time: 448 msec
S' Lateral: 3.7 cm
Single Plane A2C EF: 36.2 %
Single Plane A4C EF: 24.7 %

## 2021-03-28 NOTE — Progress Notes (Signed)
Complete echocardiogram performed.  Jimmy Adianna Darwin RDCS, RVT  

## 2021-04-02 ENCOUNTER — Telehealth: Payer: Self-pay

## 2021-04-02 NOTE — Telephone Encounter (Signed)
-----   Message from Jenean Lindau, MD sent at 04/01/2021  5:33 PM EDT ----- Moderately depressed left ventricular systolic function getting in view of advanced age and underlying that she is taking care of activities of daily living, will be able continue medical management.  Copy primary care for Jenean Lindau, MD 04/01/2021 5:32 PM

## 2021-04-02 NOTE — Telephone Encounter (Signed)
Left message on patients voicemail to please return our call.   

## 2021-04-04 ENCOUNTER — Telehealth: Payer: Self-pay

## 2021-04-04 NOTE — Telephone Encounter (Signed)
Spoke with patient regarding results and recommendation.  Patient verbalizes understanding and is agreeable to plan of care. Advised patient to call back with any issues or concerns.  

## 2021-04-04 NOTE — Telephone Encounter (Signed)
-----   Message from Rajan R Revankar, MD sent at 04/01/2021  5:33 PM EDT ----- Moderately depressed left ventricular systolic function getting in view of advanced age and underlying that she is taking care of activities of daily living, will be able continue medical management.  Copy primary care for Rajan R Revankar, MD 04/01/2021 5:32 PM  

## 2021-04-08 DIAGNOSIS — E785 Hyperlipidemia, unspecified: Secondary | ICD-10-CM | POA: Diagnosis not present

## 2021-04-08 DIAGNOSIS — M25511 Pain in right shoulder: Secondary | ICD-10-CM | POA: Diagnosis not present

## 2021-04-08 DIAGNOSIS — E039 Hypothyroidism, unspecified: Secondary | ICD-10-CM | POA: Diagnosis not present

## 2021-04-08 DIAGNOSIS — R413 Other amnesia: Secondary | ICD-10-CM | POA: Diagnosis not present

## 2021-04-08 DIAGNOSIS — R69 Illness, unspecified: Secondary | ICD-10-CM | POA: Diagnosis not present

## 2021-04-08 DIAGNOSIS — Z6825 Body mass index (BMI) 25.0-25.9, adult: Secondary | ICD-10-CM | POA: Diagnosis not present

## 2021-04-08 DIAGNOSIS — I10 Essential (primary) hypertension: Secondary | ICD-10-CM | POA: Diagnosis not present

## 2021-04-08 DIAGNOSIS — E1169 Type 2 diabetes mellitus with other specified complication: Secondary | ICD-10-CM | POA: Diagnosis not present

## 2021-04-08 DIAGNOSIS — I482 Chronic atrial fibrillation, unspecified: Secondary | ICD-10-CM | POA: Diagnosis not present

## 2021-04-08 DIAGNOSIS — H6121 Impacted cerumen, right ear: Secondary | ICD-10-CM | POA: Diagnosis not present

## 2021-05-06 DIAGNOSIS — M25511 Pain in right shoulder: Secondary | ICD-10-CM | POA: Diagnosis not present

## 2021-06-18 DIAGNOSIS — H61303 Acquired stenosis of external ear canal, unspecified, bilateral: Secondary | ICD-10-CM | POA: Diagnosis not present

## 2021-06-18 DIAGNOSIS — H6121 Impacted cerumen, right ear: Secondary | ICD-10-CM | POA: Diagnosis not present

## 2021-06-18 DIAGNOSIS — Z974 Presence of external hearing-aid: Secondary | ICD-10-CM | POA: Diagnosis not present

## 2021-06-18 DIAGNOSIS — H9193 Unspecified hearing loss, bilateral: Secondary | ICD-10-CM | POA: Diagnosis not present

## 2021-07-09 DIAGNOSIS — E039 Hypothyroidism, unspecified: Secondary | ICD-10-CM | POA: Diagnosis not present

## 2021-07-09 DIAGNOSIS — R69 Illness, unspecified: Secondary | ICD-10-CM | POA: Diagnosis not present

## 2021-07-09 DIAGNOSIS — Z139 Encounter for screening, unspecified: Secondary | ICD-10-CM | POA: Diagnosis not present

## 2021-07-09 DIAGNOSIS — Z6825 Body mass index (BMI) 25.0-25.9, adult: Secondary | ICD-10-CM | POA: Diagnosis not present

## 2021-07-09 DIAGNOSIS — Z9181 History of falling: Secondary | ICD-10-CM | POA: Diagnosis not present

## 2021-07-09 DIAGNOSIS — E785 Hyperlipidemia, unspecified: Secondary | ICD-10-CM | POA: Diagnosis not present

## 2021-07-09 DIAGNOSIS — I482 Chronic atrial fibrillation, unspecified: Secondary | ICD-10-CM | POA: Diagnosis not present

## 2021-07-09 DIAGNOSIS — R413 Other amnesia: Secondary | ICD-10-CM | POA: Diagnosis not present

## 2021-07-09 DIAGNOSIS — E1169 Type 2 diabetes mellitus with other specified complication: Secondary | ICD-10-CM | POA: Diagnosis not present

## 2021-07-09 DIAGNOSIS — I10 Essential (primary) hypertension: Secondary | ICD-10-CM | POA: Diagnosis not present

## 2021-09-10 ENCOUNTER — Other Ambulatory Visit: Payer: Self-pay

## 2021-09-11 ENCOUNTER — Ambulatory Visit: Payer: Medicare HMO | Admitting: Cardiology

## 2021-09-11 ENCOUNTER — Encounter: Payer: Self-pay | Admitting: Cardiology

## 2021-09-11 ENCOUNTER — Ambulatory Visit (INDEPENDENT_AMBULATORY_CARE_PROVIDER_SITE_OTHER): Payer: Medicare HMO

## 2021-09-11 ENCOUNTER — Other Ambulatory Visit: Payer: Self-pay

## 2021-09-11 VITALS — BP 142/86 | HR 104 | Ht 63.0 in | Wt 140.4 lb

## 2021-09-11 DIAGNOSIS — E119 Type 2 diabetes mellitus without complications: Secondary | ICD-10-CM | POA: Diagnosis not present

## 2021-09-11 DIAGNOSIS — I10 Essential (primary) hypertension: Secondary | ICD-10-CM | POA: Diagnosis not present

## 2021-09-11 DIAGNOSIS — I4821 Permanent atrial fibrillation: Secondary | ICD-10-CM | POA: Diagnosis not present

## 2021-09-11 DIAGNOSIS — Z950 Presence of cardiac pacemaker: Secondary | ICD-10-CM | POA: Diagnosis not present

## 2021-09-11 NOTE — Patient Instructions (Addendum)
Medication Instructions:  No medication changes. *If you need a refill on your cardiac medications before your next appointment, please call your pharmacy*   Lab Work: None ordered If you have labs (blood work) drawn today and your tests are completely normal, you will receive your results only by: [place] (if you have MyChart) OR A paper copy in the mail If you have any lab test that is abnormal or we need to change your treatment, we will call you to review the results.   Testing/Procedures:   WHY IS MY DOCTOR PRESCRIBING ZIO? The Zio system is proven and trusted by physicians to detect and diagnose irregular heart rhythms -- and has been prescribed to hundreds of thousands of patients.  The FDA has cleared the Zio system to monitor for many different kinds of irregular heart rhythms. In a study, physicians were able to reach a diagnosis 90% of the time with the Zio system1.  You can wear the Zio monitor -- a small, discreet, comfortable patch -- during your normal day-to-day activity, including while you sleep, shower, and exercise, while it records every single heartbeat for analysis.  1Barrett, P., et al. Comparison of 24 Hour Holter Monitoring Versus 14 Day Novel Adhesive Patch Electrocardiographic Monitoring. [place], 2014.  ZIO VS. HOLTER MONITORING The Zio monitor can be comfortably worn for up to 14 days. Holter monitors can be worn for 24 to 48 hours, limiting the time to record any irregular heart rhythms you may have. Zio is able to capture data for the 51% of patients who have their first symptom-triggered arrhythmia after 48 hours.1  LIVE WITHOUT RESTRICTIONS The Zio ambulatory cardiac monitor is a small, unobtrusive, and water-resistant patch--you might even forget you're wearing it. The Zio monitor records and stores every beat of your heart, whether you're sleeping, working out, or showering. Wear the monitor for 14 days. Remove  09/25/21.  Follow-Up: At [facility], you and your health needs are our priority.  As part of our continuing mission to provide you with exceptional heart care, we have created designated Provider Care Teams.  These Care Teams include your primary Cardiologist (physician) and Advanced Practice Providers (APPs -  Physician Assistants and Nurse Practitioners) who all work together to provide you with the care you need, when you need it.  We recommend signing up for the patient portal called "MyChart".  Sign up information is provided on this After Visit Summary.  MyChart is used to connect with patients for Virtual Visits (Telemedicine).  Patients are able to view lab/test results, encounter notes, upcoming appointments, etc.  Non-urgent messages can be sent to your provider as well.   To learn more about what you can do with MyChart, go to [email or web address].    Your next appointment:   6 month(s)  The format for your next appointment:   In Person  Provider:   Jyl Heinz, MD

## 2021-09-11 NOTE — Progress Notes (Signed)
Cardiology Office Note:    Date:  09/11/2021   ID:  Brittany Kramer, DOB 04-10-1926, MRN [id number]  PCP:  Lowella Dandy, NP  Cardiologist:  Jenean Lindau, MD   Referring MD: Lowella Dandy, NP    ASSESSMENT:    1. Permanent atrial fibrillation ([place])   2. Benign essential hypertension   3. Diabetes mellitus without complication ([place])   4. History of pacemaker    PLAN:    In order of problems listed above:  Primary prevention stressed with the patient.  Importance of compliance with diet medication stressed any vocalized understanding.  Her daughter accompanies her for this visit and she is a Marine scientist and very supportive. Permanent atrial fibrillation:I discussed with the patient atrial fibrillation, disease process. Management and therapy including rate and rhythm control, anticoagulation benefits and potential risks were discussed extensively with the patient. Patient had multiple questions which were answered to patient's satisfaction. History of permanent pacemaker: End of life long time ago.  It does not bother her now.  She has no bradycardia arrhythmia issues. For her atrial fibrillation I would like to do a 2-week monitor to assess her heart rate control at this is agreeable to her. Cardiac murmur: Echocardiogram will be done to assess murmur heard on auscultation. Patient will be seen in follow-up appointment in 6 months or earlier if the patient has any concerns    Medication Adjustments/Labs and Tests Ordered: Current medicines are reviewed at length with the patient today.  Concerns regarding medicines are outlined above.  No orders of the defined types were placed in this encounter.  No orders of the defined types were placed in this encounter.    No chief complaint on file.    History of Present Illness:    Brittany Kramer is a 87 y.o. female.  Patient has past medical history of essential hypertension, atrial fibrillation, diabetes mellitus and permanent  pacemaker.  She mentions to me that her past medical does not work now.  She has no issues with it.  She has no bradycardia issues.  At the time of my evaluation, the patient is alert awake oriented and in no distress.  Past Medical History:  Diagnosis Date   Acute blood loss anemia 08/04/2015   Acute GI bleeding 08/04/2015   Arrhythmia    Atrial fibrillation ([place])    Benign essential hypertension 10/13/2018   Breast cancer ([place])    Cancer ([place])    CHF (congestive heart failure) ([place])    Diabetes mellitus without complication ([place])    Diverticulitis    Diverticulosis 08/04/2015   GERD (gastroesophageal reflux disease)    Gestational diabetes, diet controlled    History of pacemaker 08/04/2015   HLD (hyperlipidemia) 08/04/2015   HTN (hypertension) 08/04/2015   Hyperlipidemia    Hypertension    Hypothyroidism    Iron deficiency anemia 08/04/2015   Permanent atrial fibrillation ([place]) 03/04/2021   SSS (sick sinus syndrome) ([place]) 08/14/2016   Thyroid disease     Past Surgical History:  Procedure Laterality Date   COLONOSCOPY  2004   ESOPHAGOGASTRODUODENOSCOPY N/A 08/04/2015   Procedure: ESOPHAGOGASTRODUODENOSCOPY (EGD);  Surgeon: [name], MD;  Location: [facility] ENDOSCOPY;  Service: Endoscopy;  Laterality: N/A;   INSERT / REPLACE / REMOVE PACEMAKER      Current Medications: Current Meds  Medication Sig   atorvastatin (LIPITOR) 40 MG tablet Take 40 mg by mouth daily.   ELIQUIS 2.5 MG TABS tablet Take 2.5 mg by mouth 2 (two) times daily.  furosemide (LASIX) 40 MG tablet Take 40 mg by mouth daily.   levothyroxine (SYNTHROID, LEVOTHROID) 100 MCG tablet Take 100 mcg by mouth daily.   losartan (COZAAR) 50 MG tablet Take 50 mg by mouth daily.   pantoprazole (PROTONIX) 40 MG tablet Take 1 tablet (40 mg total) by mouth daily.   potassium chloride SA (K-DUR,KLOR-CON) 20 MEQ tablet Take 40 mEq by mouth 2 (two) times daily.    QUEtiapine (SEROQUEL) 50 MG tablet Take 50 mg by mouth daily.    sertraline  (ZOLOFT) 100 MG tablet Take 100 mg by mouth daily.     Allergies:   Tramadol   Social History   Socioeconomic History   Marital status: Married    Spouse name: Not on file   Number of children: Not on file   Years of education: Not on file   Highest education level: Not on file  Occupational History   Not on file  Tobacco Use   Smoking status: Never   Smokeless tobacco: Never  Substance and Sexual Activity   Alcohol use: No   Drug use: No   Sexual activity: Not on file  Other Topics Concern   Not on file  Social History Narrative   Not on file   Social Determinants of Health   Financial Resource Strain: Not on file  Food Insecurity: Not on file  Transportation Needs: Not on file  Physical Activity: Not on file  Stress: Not on file  Social Connections: Not on file     Family History: The patient's family history includes AAA (abdominal aortic aneurysm) in her brother and mother; Heart disease in her father.  ROS:   Please see the history of present illness.    All other systems reviewed and are negative.  EKGs/Labs/Other Studies Reviewed:    The following studies were reviewed today: I discussed my findings with the patient at length.   Recent Labs: No results found for requested labs within last 8760 hours.  Recent Lipid Panel No results found for: CHOL, TRIG, HDL, CHOLHDL, VLDL, LDLCALC, LDLDIRECT  Physical Exam:    VS:  BP (!) 142/86   Pulse (!) 104   Ht 5\' 3"  (1.6 m)   Wt 140 lb 6.4 oz (63.7 kg)   LMP  (LMP Unknown)   SpO2 94%   BMI 24.87 kg/m     Wt Readings from Last 3 Encounters:  09/11/21 140 lb 6.4 oz (63.7 kg)  03/04/21 148 lb 6.4 oz (67.3 kg)  12/20/18 147 lb (66.7 kg)     GEN: Patient is in no acute distress HEENT: Normal NECK: No JVD; No carotid bruits LYMPHATICS: No lymphadenopathy CARDIAC: Hear sounds regular, 2/6 systolic murmur at the apex. RESPIRATORY:  Clear to auscultation without rales, wheezing or rhonchi  ABDOMEN:  Soft, non-tender, non-distended MUSCULOSKELETAL:  No edema; No deformity  SKIN: Warm and dry NEUROLOGIC:  Alert and oriented x 3 PSYCHIATRIC:  Normal affect   Signed, Jenean Lindau, MD  09/11/2021 2:20 PM    [place]

## 2021-09-11 NOTE — Progress Notes (Signed)
Monitor was placed on pt's back.

## 2021-10-04 ENCOUNTER — Telehealth: Payer: Self-pay | Admitting: Cardiology

## 2021-10-04 DIAGNOSIS — I4821 Permanent atrial fibrillation: Secondary | ICD-10-CM | POA: Diagnosis not present

## 2021-10-04 MED ORDER — METOPROLOL SUCCINATE ER 25 MG PO TB24: 25.0000 mg | ORAL_TABLET | Freq: Every day | ORAL | 1 refills | Status: DC

## 2021-10-04 NOTE — Telephone Encounter (Signed)
Left message for patient to return call.

## 2021-10-04 NOTE — Telephone Encounter (Signed)
Called iRythm back. Patient had a episode of atrial fibrillation 181 beats per minute lasting 60 seconds long on 09/13/21 at 3:55pm. Report has been posted. Will send to Dr. Geraldo Pitter for further advisement.

## 2021-10-04 NOTE — Telephone Encounter (Signed)
Reference # [id number]

## 2021-10-04 NOTE — Telephone Encounter (Signed)
Brittany Kramer, Daughter of the patient was returning a call from the office

## 2021-10-04 NOTE — Telephone Encounter (Signed)
Spoke to patient daughter per dpr. Informed her of monitor result and Dr. Julien Nordmann recommendation to stop losartan and start metoprolol succinate 25 mg daily. Also informed patient daughter to check patients blood pressure and pulse everyday and bring a log of it to office middle of next week. Patient daughter will try to get a reading everyday but she does not live with patient so may be difficult. She will do the best she can and inform patient of medication changes.

## 2021-10-04 NOTE — Telephone Encounter (Signed)
Zio calling with abnormal monitor results

## 2021-10-15 DIAGNOSIS — E039 Hypothyroidism, unspecified: Secondary | ICD-10-CM | POA: Diagnosis not present

## 2021-10-15 DIAGNOSIS — R69 Illness, unspecified: Secondary | ICD-10-CM | POA: Diagnosis not present

## 2021-10-15 DIAGNOSIS — E1169 Type 2 diabetes mellitus with other specified complication: Secondary | ICD-10-CM | POA: Diagnosis not present

## 2021-10-15 DIAGNOSIS — Z8719 Personal history of other diseases of the digestive system: Secondary | ICD-10-CM | POA: Diagnosis not present

## 2021-10-15 DIAGNOSIS — Z6825 Body mass index (BMI) 25.0-25.9, adult: Secondary | ICD-10-CM | POA: Diagnosis not present

## 2021-10-15 DIAGNOSIS — R413 Other amnesia: Secondary | ICD-10-CM | POA: Diagnosis not present

## 2021-10-15 DIAGNOSIS — I482 Chronic atrial fibrillation, unspecified: Secondary | ICD-10-CM | POA: Diagnosis not present

## 2021-10-15 DIAGNOSIS — E785 Hyperlipidemia, unspecified: Secondary | ICD-10-CM | POA: Diagnosis not present

## 2021-10-15 DIAGNOSIS — I10 Essential (primary) hypertension: Secondary | ICD-10-CM | POA: Diagnosis not present

## 2022-01-24 DIAGNOSIS — E1169 Type 2 diabetes mellitus with other specified complication: Secondary | ICD-10-CM | POA: Diagnosis not present

## 2022-01-24 DIAGNOSIS — R262 Difficulty in walking, not elsewhere classified: Secondary | ICD-10-CM | POA: Diagnosis not present

## 2022-01-24 DIAGNOSIS — Z6823 Body mass index (BMI) 23.0-23.9, adult: Secondary | ICD-10-CM | POA: Diagnosis not present

## 2022-01-24 DIAGNOSIS — I482 Chronic atrial fibrillation, unspecified: Secondary | ICD-10-CM | POA: Diagnosis not present

## 2022-01-24 DIAGNOSIS — E039 Hypothyroidism, unspecified: Secondary | ICD-10-CM | POA: Diagnosis not present

## 2022-01-24 DIAGNOSIS — E785 Hyperlipidemia, unspecified: Secondary | ICD-10-CM | POA: Diagnosis not present

## 2022-01-24 DIAGNOSIS — Z8719 Personal history of other diseases of the digestive system: Secondary | ICD-10-CM | POA: Diagnosis not present

## 2022-01-24 DIAGNOSIS — R69 Illness, unspecified: Secondary | ICD-10-CM | POA: Diagnosis not present

## 2022-01-24 DIAGNOSIS — I10 Essential (primary) hypertension: Secondary | ICD-10-CM | POA: Diagnosis not present

## 2022-01-24 DIAGNOSIS — R413 Other amnesia: Secondary | ICD-10-CM | POA: Diagnosis not present

## 2022-01-31 DIAGNOSIS — J811 Chronic pulmonary edema: Secondary | ICD-10-CM | POA: Diagnosis not present

## 2022-01-31 DIAGNOSIS — R296 Repeated falls: Secondary | ICD-10-CM | POA: Diagnosis not present

## 2022-01-31 DIAGNOSIS — S199XXA Unspecified injury of neck, initial encounter: Secondary | ICD-10-CM | POA: Diagnosis not present

## 2022-01-31 DIAGNOSIS — Z9181 History of falling: Secondary | ICD-10-CM | POA: Diagnosis not present

## 2022-01-31 DIAGNOSIS — M25562 Pain in left knee: Secondary | ICD-10-CM | POA: Diagnosis not present

## 2022-01-31 DIAGNOSIS — S0990XA Unspecified injury of head, initial encounter: Secondary | ICD-10-CM | POA: Diagnosis not present

## 2022-01-31 DIAGNOSIS — W19XXXA Unspecified fall, initial encounter: Secondary | ICD-10-CM | POA: Diagnosis not present

## 2022-01-31 DIAGNOSIS — I517 Cardiomegaly: Secondary | ICD-10-CM | POA: Diagnosis not present

## 2022-01-31 DIAGNOSIS — S82002A Unspecified fracture of left patella, initial encounter for closed fracture: Secondary | ICD-10-CM | POA: Diagnosis not present

## 2022-01-31 DIAGNOSIS — J439 Emphysema, unspecified: Secondary | ICD-10-CM | POA: Diagnosis not present

## 2022-01-31 DIAGNOSIS — Z043 Encounter for examination and observation following other accident: Secondary | ICD-10-CM | POA: Diagnosis not present

## 2022-01-31 DIAGNOSIS — R262 Difficulty in walking, not elsewhere classified: Secondary | ICD-10-CM | POA: Diagnosis not present

## 2022-01-31 DIAGNOSIS — B3731 Acute candidiasis of vulva and vagina: Secondary | ICD-10-CM | POA: Diagnosis not present

## 2022-01-31 DIAGNOSIS — R6889 Other general symptoms and signs: Secondary | ICD-10-CM | POA: Diagnosis not present

## 2022-01-31 DIAGNOSIS — M47812 Spondylosis without myelopathy or radiculopathy, cervical region: Secondary | ICD-10-CM | POA: Diagnosis not present

## 2022-01-31 DIAGNOSIS — M2578 Osteophyte, vertebrae: Secondary | ICD-10-CM | POA: Diagnosis not present

## 2022-01-31 DIAGNOSIS — S0083XA Contusion of other part of head, initial encounter: Secondary | ICD-10-CM | POA: Diagnosis not present

## 2022-01-31 DIAGNOSIS — N390 Urinary tract infection, site not specified: Secondary | ICD-10-CM | POA: Diagnosis not present

## 2022-01-31 DIAGNOSIS — Z743 Need for continuous supervision: Secondary | ICD-10-CM | POA: Diagnosis not present

## 2022-01-31 DIAGNOSIS — I6529 Occlusion and stenosis of unspecified carotid artery: Secondary | ICD-10-CM | POA: Diagnosis not present

## 2022-01-31 DIAGNOSIS — J342 Deviated nasal septum: Secondary | ICD-10-CM | POA: Diagnosis not present

## 2022-02-10 DIAGNOSIS — S82035A Nondisplaced transverse fracture of left patella, initial encounter for closed fracture: Secondary | ICD-10-CM | POA: Diagnosis not present

## 2022-02-11 DIAGNOSIS — I10 Essential (primary) hypertension: Secondary | ICD-10-CM | POA: Diagnosis not present

## 2022-02-11 DIAGNOSIS — E785 Hyperlipidemia, unspecified: Secondary | ICD-10-CM | POA: Diagnosis not present

## 2022-02-11 DIAGNOSIS — I482 Chronic atrial fibrillation, unspecified: Secondary | ICD-10-CM | POA: Diagnosis not present

## 2022-02-11 DIAGNOSIS — R262 Difficulty in walking, not elsewhere classified: Secondary | ICD-10-CM | POA: Diagnosis not present

## 2022-02-11 DIAGNOSIS — E1169 Type 2 diabetes mellitus with other specified complication: Secondary | ICD-10-CM | POA: Diagnosis not present

## 2022-02-11 DIAGNOSIS — R69 Illness, unspecified: Secondary | ICD-10-CM | POA: Diagnosis not present

## 2022-02-17 DIAGNOSIS — S82035A Nondisplaced transverse fracture of left patella, initial encounter for closed fracture: Secondary | ICD-10-CM | POA: Diagnosis not present

## 2022-02-20 DIAGNOSIS — E876 Hypokalemia: Secondary | ICD-10-CM | POA: Diagnosis not present

## 2022-02-20 DIAGNOSIS — I509 Heart failure, unspecified: Secondary | ICD-10-CM | POA: Diagnosis not present

## 2022-02-20 DIAGNOSIS — E1169 Type 2 diabetes mellitus with other specified complication: Secondary | ICD-10-CM | POA: Diagnosis not present

## 2022-02-20 DIAGNOSIS — N390 Urinary tract infection, site not specified: Secondary | ICD-10-CM | POA: Diagnosis not present

## 2022-02-20 DIAGNOSIS — E871 Hypo-osmolality and hyponatremia: Secondary | ICD-10-CM | POA: Diagnosis not present

## 2022-02-20 DIAGNOSIS — I482 Chronic atrial fibrillation, unspecified: Secondary | ICD-10-CM | POA: Diagnosis not present

## 2022-02-20 DIAGNOSIS — R69 Illness, unspecified: Secondary | ICD-10-CM | POA: Diagnosis not present

## 2022-02-20 DIAGNOSIS — D509 Iron deficiency anemia, unspecified: Secondary | ICD-10-CM | POA: Diagnosis not present

## 2022-02-20 DIAGNOSIS — H900 Conductive hearing loss, bilateral: Secondary | ICD-10-CM | POA: Diagnosis not present

## 2022-02-20 DIAGNOSIS — R413 Other amnesia: Secondary | ICD-10-CM | POA: Diagnosis not present

## 2022-02-20 DIAGNOSIS — I110 Hypertensive heart disease with heart failure: Secondary | ICD-10-CM | POA: Diagnosis not present

## 2022-02-20 DIAGNOSIS — S82002D Unspecified fracture of left patella, subsequent encounter for closed fracture with routine healing: Secondary | ICD-10-CM | POA: Diagnosis not present

## 2022-02-20 DIAGNOSIS — Z950 Presence of cardiac pacemaker: Secondary | ICD-10-CM | POA: Diagnosis not present

## 2022-02-20 DIAGNOSIS — H6123 Impacted cerumen, bilateral: Secondary | ICD-10-CM | POA: Diagnosis not present

## 2022-02-20 DIAGNOSIS — F419 Anxiety disorder, unspecified: Secondary | ICD-10-CM | POA: Diagnosis not present

## 2022-02-20 DIAGNOSIS — K219 Gastro-esophageal reflux disease without esophagitis: Secondary | ICD-10-CM | POA: Diagnosis not present

## 2022-02-20 DIAGNOSIS — G459 Transient cerebral ischemic attack, unspecified: Secondary | ICD-10-CM | POA: Diagnosis not present

## 2022-02-20 DIAGNOSIS — Z7901 Long term (current) use of anticoagulants: Secondary | ICD-10-CM | POA: Diagnosis not present

## 2022-02-20 DIAGNOSIS — M1990 Unspecified osteoarthritis, unspecified site: Secondary | ICD-10-CM | POA: Diagnosis not present

## 2022-02-20 DIAGNOSIS — B3731 Acute candidiasis of vulva and vagina: Secondary | ICD-10-CM | POA: Diagnosis not present

## 2022-02-20 DIAGNOSIS — F32A Depression, unspecified: Secondary | ICD-10-CM | POA: Diagnosis not present

## 2022-02-20 DIAGNOSIS — K589 Irritable bowel syndrome without diarrhea: Secondary | ICD-10-CM | POA: Diagnosis not present

## 2022-02-20 DIAGNOSIS — G4700 Insomnia, unspecified: Secondary | ICD-10-CM | POA: Diagnosis not present

## 2022-02-20 DIAGNOSIS — E7800 Pure hypercholesterolemia, unspecified: Secondary | ICD-10-CM | POA: Diagnosis not present

## 2022-02-20 DIAGNOSIS — E039 Hypothyroidism, unspecified: Secondary | ICD-10-CM | POA: Diagnosis not present

## 2022-02-24 DIAGNOSIS — E1169 Type 2 diabetes mellitus with other specified complication: Secondary | ICD-10-CM | POA: Diagnosis not present

## 2022-02-24 DIAGNOSIS — H6123 Impacted cerumen, bilateral: Secondary | ICD-10-CM | POA: Diagnosis not present

## 2022-02-24 DIAGNOSIS — I110 Hypertensive heart disease with heart failure: Secondary | ICD-10-CM | POA: Diagnosis not present

## 2022-02-24 DIAGNOSIS — S82002D Unspecified fracture of left patella, subsequent encounter for closed fracture with routine healing: Secondary | ICD-10-CM | POA: Diagnosis not present

## 2022-02-24 DIAGNOSIS — H900 Conductive hearing loss, bilateral: Secondary | ICD-10-CM | POA: Diagnosis not present

## 2022-02-24 DIAGNOSIS — N390 Urinary tract infection, site not specified: Secondary | ICD-10-CM | POA: Diagnosis not present

## 2022-02-24 DIAGNOSIS — B3731 Acute candidiasis of vulva and vagina: Secondary | ICD-10-CM | POA: Diagnosis not present

## 2022-02-24 DIAGNOSIS — E7800 Pure hypercholesterolemia, unspecified: Secondary | ICD-10-CM | POA: Diagnosis not present

## 2022-02-24 DIAGNOSIS — I482 Chronic atrial fibrillation, unspecified: Secondary | ICD-10-CM | POA: Diagnosis not present

## 2022-02-24 DIAGNOSIS — R69 Illness, unspecified: Secondary | ICD-10-CM | POA: Diagnosis not present

## 2022-02-25 DIAGNOSIS — R69 Illness, unspecified: Secondary | ICD-10-CM | POA: Diagnosis not present

## 2022-02-25 DIAGNOSIS — I482 Chronic atrial fibrillation, unspecified: Secondary | ICD-10-CM | POA: Diagnosis not present

## 2022-02-25 DIAGNOSIS — N390 Urinary tract infection, site not specified: Secondary | ICD-10-CM | POA: Diagnosis not present

## 2022-02-25 DIAGNOSIS — E1169 Type 2 diabetes mellitus with other specified complication: Secondary | ICD-10-CM | POA: Diagnosis not present

## 2022-02-25 DIAGNOSIS — B3731 Acute candidiasis of vulva and vagina: Secondary | ICD-10-CM | POA: Diagnosis not present

## 2022-02-25 DIAGNOSIS — I110 Hypertensive heart disease with heart failure: Secondary | ICD-10-CM | POA: Diagnosis not present

## 2022-02-25 DIAGNOSIS — E7800 Pure hypercholesterolemia, unspecified: Secondary | ICD-10-CM | POA: Diagnosis not present

## 2022-02-25 DIAGNOSIS — H6123 Impacted cerumen, bilateral: Secondary | ICD-10-CM | POA: Diagnosis not present

## 2022-02-25 DIAGNOSIS — S82002D Unspecified fracture of left patella, subsequent encounter for closed fracture with routine healing: Secondary | ICD-10-CM | POA: Diagnosis not present

## 2022-02-25 DIAGNOSIS — H900 Conductive hearing loss, bilateral: Secondary | ICD-10-CM | POA: Diagnosis not present

## 2022-02-27 DIAGNOSIS — S82035A Nondisplaced transverse fracture of left patella, initial encounter for closed fracture: Secondary | ICD-10-CM | POA: Diagnosis not present

## 2022-02-27 DIAGNOSIS — I110 Hypertensive heart disease with heart failure: Secondary | ICD-10-CM | POA: Diagnosis not present

## 2022-02-27 DIAGNOSIS — H6123 Impacted cerumen, bilateral: Secondary | ICD-10-CM | POA: Diagnosis not present

## 2022-02-27 DIAGNOSIS — I482 Chronic atrial fibrillation, unspecified: Secondary | ICD-10-CM | POA: Diagnosis not present

## 2022-02-27 DIAGNOSIS — N390 Urinary tract infection, site not specified: Secondary | ICD-10-CM | POA: Diagnosis not present

## 2022-02-27 DIAGNOSIS — S82002D Unspecified fracture of left patella, subsequent encounter for closed fracture with routine healing: Secondary | ICD-10-CM | POA: Diagnosis not present

## 2022-02-27 DIAGNOSIS — E1169 Type 2 diabetes mellitus with other specified complication: Secondary | ICD-10-CM | POA: Diagnosis not present

## 2022-02-27 DIAGNOSIS — E7800 Pure hypercholesterolemia, unspecified: Secondary | ICD-10-CM | POA: Diagnosis not present

## 2022-02-27 DIAGNOSIS — R69 Illness, unspecified: Secondary | ICD-10-CM | POA: Diagnosis not present

## 2022-02-27 DIAGNOSIS — H900 Conductive hearing loss, bilateral: Secondary | ICD-10-CM | POA: Diagnosis not present

## 2022-02-27 DIAGNOSIS — B3731 Acute candidiasis of vulva and vagina: Secondary | ICD-10-CM | POA: Diagnosis not present

## 2022-02-28 DIAGNOSIS — H6123 Impacted cerumen, bilateral: Secondary | ICD-10-CM | POA: Diagnosis not present

## 2022-02-28 DIAGNOSIS — I110 Hypertensive heart disease with heart failure: Secondary | ICD-10-CM | POA: Diagnosis not present

## 2022-02-28 DIAGNOSIS — S82002D Unspecified fracture of left patella, subsequent encounter for closed fracture with routine healing: Secondary | ICD-10-CM | POA: Diagnosis not present

## 2022-02-28 DIAGNOSIS — H900 Conductive hearing loss, bilateral: Secondary | ICD-10-CM | POA: Diagnosis not present

## 2022-02-28 DIAGNOSIS — E1169 Type 2 diabetes mellitus with other specified complication: Secondary | ICD-10-CM | POA: Diagnosis not present

## 2022-02-28 DIAGNOSIS — I482 Chronic atrial fibrillation, unspecified: Secondary | ICD-10-CM | POA: Diagnosis not present

## 2022-02-28 DIAGNOSIS — E7800 Pure hypercholesterolemia, unspecified: Secondary | ICD-10-CM | POA: Diagnosis not present

## 2022-02-28 DIAGNOSIS — N390 Urinary tract infection, site not specified: Secondary | ICD-10-CM | POA: Diagnosis not present

## 2022-02-28 DIAGNOSIS — R69 Illness, unspecified: Secondary | ICD-10-CM | POA: Diagnosis not present

## 2022-02-28 DIAGNOSIS — B3731 Acute candidiasis of vulva and vagina: Secondary | ICD-10-CM | POA: Diagnosis not present

## 2022-03-03 DIAGNOSIS — H6123 Impacted cerumen, bilateral: Secondary | ICD-10-CM | POA: Diagnosis not present

## 2022-03-03 DIAGNOSIS — S82009A Unspecified fracture of unspecified patella, initial encounter for closed fracture: Secondary | ICD-10-CM | POA: Diagnosis not present

## 2022-03-03 DIAGNOSIS — H61303 Acquired stenosis of external ear canal, unspecified, bilateral: Secondary | ICD-10-CM | POA: Diagnosis not present

## 2022-03-03 DIAGNOSIS — Z9181 History of falling: Secondary | ICD-10-CM | POA: Diagnosis not present

## 2022-03-04 DIAGNOSIS — S82002D Unspecified fracture of left patella, subsequent encounter for closed fracture with routine healing: Secondary | ICD-10-CM | POA: Diagnosis not present

## 2022-03-04 DIAGNOSIS — G459 Transient cerebral ischemic attack, unspecified: Secondary | ICD-10-CM | POA: Diagnosis not present

## 2022-03-04 DIAGNOSIS — E871 Hypo-osmolality and hyponatremia: Secondary | ICD-10-CM | POA: Diagnosis not present

## 2022-03-04 DIAGNOSIS — B3731 Acute candidiasis of vulva and vagina: Secondary | ICD-10-CM | POA: Diagnosis not present

## 2022-03-04 DIAGNOSIS — E039 Hypothyroidism, unspecified: Secondary | ICD-10-CM | POA: Diagnosis not present

## 2022-03-04 DIAGNOSIS — K219 Gastro-esophageal reflux disease without esophagitis: Secondary | ICD-10-CM | POA: Diagnosis not present

## 2022-03-04 DIAGNOSIS — R69 Illness, unspecified: Secondary | ICD-10-CM | POA: Diagnosis not present

## 2022-03-04 DIAGNOSIS — I110 Hypertensive heart disease with heart failure: Secondary | ICD-10-CM | POA: Diagnosis not present

## 2022-03-04 DIAGNOSIS — F419 Anxiety disorder, unspecified: Secondary | ICD-10-CM | POA: Diagnosis not present

## 2022-03-04 DIAGNOSIS — N390 Urinary tract infection, site not specified: Secondary | ICD-10-CM | POA: Diagnosis not present

## 2022-03-04 DIAGNOSIS — F32A Depression, unspecified: Secondary | ICD-10-CM | POA: Diagnosis not present

## 2022-03-04 DIAGNOSIS — Z7901 Long term (current) use of anticoagulants: Secondary | ICD-10-CM | POA: Diagnosis not present

## 2022-03-04 DIAGNOSIS — R413 Other amnesia: Secondary | ICD-10-CM | POA: Diagnosis not present

## 2022-03-04 DIAGNOSIS — E7800 Pure hypercholesterolemia, unspecified: Secondary | ICD-10-CM | POA: Diagnosis not present

## 2022-03-04 DIAGNOSIS — K589 Irritable bowel syndrome without diarrhea: Secondary | ICD-10-CM | POA: Diagnosis not present

## 2022-03-04 DIAGNOSIS — H6123 Impacted cerumen, bilateral: Secondary | ICD-10-CM | POA: Diagnosis not present

## 2022-03-04 DIAGNOSIS — E1169 Type 2 diabetes mellitus with other specified complication: Secondary | ICD-10-CM | POA: Diagnosis not present

## 2022-03-04 DIAGNOSIS — E876 Hypokalemia: Secondary | ICD-10-CM | POA: Diagnosis not present

## 2022-03-04 DIAGNOSIS — I509 Heart failure, unspecified: Secondary | ICD-10-CM | POA: Diagnosis not present

## 2022-03-04 DIAGNOSIS — M1990 Unspecified osteoarthritis, unspecified site: Secondary | ICD-10-CM | POA: Diagnosis not present

## 2022-03-04 DIAGNOSIS — D509 Iron deficiency anemia, unspecified: Secondary | ICD-10-CM | POA: Diagnosis not present

## 2022-03-04 DIAGNOSIS — H900 Conductive hearing loss, bilateral: Secondary | ICD-10-CM | POA: Diagnosis not present

## 2022-03-04 DIAGNOSIS — Z950 Presence of cardiac pacemaker: Secondary | ICD-10-CM | POA: Diagnosis not present

## 2022-03-04 DIAGNOSIS — G4700 Insomnia, unspecified: Secondary | ICD-10-CM | POA: Diagnosis not present

## 2022-03-04 DIAGNOSIS — I482 Chronic atrial fibrillation, unspecified: Secondary | ICD-10-CM | POA: Diagnosis not present

## 2022-03-05 DIAGNOSIS — I509 Heart failure, unspecified: Secondary | ICD-10-CM | POA: Diagnosis not present

## 2022-03-05 DIAGNOSIS — F419 Anxiety disorder, unspecified: Secondary | ICD-10-CM | POA: Diagnosis not present

## 2022-03-05 DIAGNOSIS — S82002D Unspecified fracture of left patella, subsequent encounter for closed fracture with routine healing: Secondary | ICD-10-CM | POA: Diagnosis not present

## 2022-03-05 DIAGNOSIS — E039 Hypothyroidism, unspecified: Secondary | ICD-10-CM | POA: Diagnosis not present

## 2022-03-05 DIAGNOSIS — B3731 Acute candidiasis of vulva and vagina: Secondary | ICD-10-CM | POA: Diagnosis not present

## 2022-03-05 DIAGNOSIS — N390 Urinary tract infection, site not specified: Secondary | ICD-10-CM | POA: Diagnosis not present

## 2022-03-05 DIAGNOSIS — F32A Depression, unspecified: Secondary | ICD-10-CM | POA: Diagnosis not present

## 2022-03-05 DIAGNOSIS — E7800 Pure hypercholesterolemia, unspecified: Secondary | ICD-10-CM | POA: Diagnosis not present

## 2022-03-05 DIAGNOSIS — H6123 Impacted cerumen, bilateral: Secondary | ICD-10-CM | POA: Diagnosis not present

## 2022-03-05 DIAGNOSIS — E876 Hypokalemia: Secondary | ICD-10-CM | POA: Diagnosis not present

## 2022-03-05 DIAGNOSIS — H900 Conductive hearing loss, bilateral: Secondary | ICD-10-CM | POA: Diagnosis not present

## 2022-03-05 DIAGNOSIS — Z950 Presence of cardiac pacemaker: Secondary | ICD-10-CM | POA: Diagnosis not present

## 2022-03-05 DIAGNOSIS — I110 Hypertensive heart disease with heart failure: Secondary | ICD-10-CM | POA: Diagnosis not present

## 2022-03-05 DIAGNOSIS — R413 Other amnesia: Secondary | ICD-10-CM | POA: Diagnosis not present

## 2022-03-05 DIAGNOSIS — M1990 Unspecified osteoarthritis, unspecified site: Secondary | ICD-10-CM | POA: Diagnosis not present

## 2022-03-05 DIAGNOSIS — K589 Irritable bowel syndrome without diarrhea: Secondary | ICD-10-CM | POA: Diagnosis not present

## 2022-03-05 DIAGNOSIS — K219 Gastro-esophageal reflux disease without esophagitis: Secondary | ICD-10-CM | POA: Diagnosis not present

## 2022-03-05 DIAGNOSIS — I482 Chronic atrial fibrillation, unspecified: Secondary | ICD-10-CM | POA: Diagnosis not present

## 2022-03-05 DIAGNOSIS — R69 Illness, unspecified: Secondary | ICD-10-CM | POA: Diagnosis not present

## 2022-03-05 DIAGNOSIS — E1169 Type 2 diabetes mellitus with other specified complication: Secondary | ICD-10-CM | POA: Diagnosis not present

## 2022-03-05 DIAGNOSIS — D509 Iron deficiency anemia, unspecified: Secondary | ICD-10-CM | POA: Diagnosis not present

## 2022-03-05 DIAGNOSIS — E871 Hypo-osmolality and hyponatremia: Secondary | ICD-10-CM | POA: Diagnosis not present

## 2022-03-05 DIAGNOSIS — G459 Transient cerebral ischemic attack, unspecified: Secondary | ICD-10-CM | POA: Diagnosis not present

## 2022-03-05 DIAGNOSIS — G4700 Insomnia, unspecified: Secondary | ICD-10-CM | POA: Diagnosis not present

## 2022-03-05 DIAGNOSIS — Z7901 Long term (current) use of anticoagulants: Secondary | ICD-10-CM | POA: Diagnosis not present

## 2022-03-06 DIAGNOSIS — G459 Transient cerebral ischemic attack, unspecified: Secondary | ICD-10-CM | POA: Diagnosis not present

## 2022-03-06 DIAGNOSIS — E876 Hypokalemia: Secondary | ICD-10-CM | POA: Diagnosis not present

## 2022-03-06 DIAGNOSIS — E039 Hypothyroidism, unspecified: Secondary | ICD-10-CM | POA: Diagnosis not present

## 2022-03-06 DIAGNOSIS — R413 Other amnesia: Secondary | ICD-10-CM | POA: Diagnosis not present

## 2022-03-06 DIAGNOSIS — I482 Chronic atrial fibrillation, unspecified: Secondary | ICD-10-CM | POA: Diagnosis not present

## 2022-03-06 DIAGNOSIS — R69 Illness, unspecified: Secondary | ICD-10-CM | POA: Diagnosis not present

## 2022-03-06 DIAGNOSIS — K589 Irritable bowel syndrome without diarrhea: Secondary | ICD-10-CM | POA: Diagnosis not present

## 2022-03-06 DIAGNOSIS — G4700 Insomnia, unspecified: Secondary | ICD-10-CM | POA: Diagnosis not present

## 2022-03-06 DIAGNOSIS — F419 Anxiety disorder, unspecified: Secondary | ICD-10-CM | POA: Diagnosis not present

## 2022-03-06 DIAGNOSIS — D509 Iron deficiency anemia, unspecified: Secondary | ICD-10-CM | POA: Diagnosis not present

## 2022-03-06 DIAGNOSIS — Z950 Presence of cardiac pacemaker: Secondary | ICD-10-CM | POA: Diagnosis not present

## 2022-03-06 DIAGNOSIS — S82002D Unspecified fracture of left patella, subsequent encounter for closed fracture with routine healing: Secondary | ICD-10-CM | POA: Diagnosis not present

## 2022-03-06 DIAGNOSIS — B3731 Acute candidiasis of vulva and vagina: Secondary | ICD-10-CM | POA: Diagnosis not present

## 2022-03-06 DIAGNOSIS — M1990 Unspecified osteoarthritis, unspecified site: Secondary | ICD-10-CM | POA: Diagnosis not present

## 2022-03-06 DIAGNOSIS — H900 Conductive hearing loss, bilateral: Secondary | ICD-10-CM | POA: Diagnosis not present

## 2022-03-06 DIAGNOSIS — I509 Heart failure, unspecified: Secondary | ICD-10-CM | POA: Diagnosis not present

## 2022-03-06 DIAGNOSIS — E871 Hypo-osmolality and hyponatremia: Secondary | ICD-10-CM | POA: Diagnosis not present

## 2022-03-06 DIAGNOSIS — F32A Depression, unspecified: Secondary | ICD-10-CM | POA: Diagnosis not present

## 2022-03-06 DIAGNOSIS — H6123 Impacted cerumen, bilateral: Secondary | ICD-10-CM | POA: Diagnosis not present

## 2022-03-06 DIAGNOSIS — Z7901 Long term (current) use of anticoagulants: Secondary | ICD-10-CM | POA: Diagnosis not present

## 2022-03-06 DIAGNOSIS — I110 Hypertensive heart disease with heart failure: Secondary | ICD-10-CM | POA: Diagnosis not present

## 2022-03-06 DIAGNOSIS — K219 Gastro-esophageal reflux disease without esophagitis: Secondary | ICD-10-CM | POA: Diagnosis not present

## 2022-03-06 DIAGNOSIS — N390 Urinary tract infection, site not specified: Secondary | ICD-10-CM | POA: Diagnosis not present

## 2022-03-06 DIAGNOSIS — E1169 Type 2 diabetes mellitus with other specified complication: Secondary | ICD-10-CM | POA: Diagnosis not present

## 2022-03-06 DIAGNOSIS — E7800 Pure hypercholesterolemia, unspecified: Secondary | ICD-10-CM | POA: Diagnosis not present

## 2022-03-11 DIAGNOSIS — B3731 Acute candidiasis of vulva and vagina: Secondary | ICD-10-CM | POA: Diagnosis not present

## 2022-03-11 DIAGNOSIS — K219 Gastro-esophageal reflux disease without esophagitis: Secondary | ICD-10-CM | POA: Diagnosis not present

## 2022-03-11 DIAGNOSIS — S82002D Unspecified fracture of left patella, subsequent encounter for closed fracture with routine healing: Secondary | ICD-10-CM | POA: Diagnosis not present

## 2022-03-11 DIAGNOSIS — E876 Hypokalemia: Secondary | ICD-10-CM | POA: Diagnosis not present

## 2022-03-11 DIAGNOSIS — E7800 Pure hypercholesterolemia, unspecified: Secondary | ICD-10-CM | POA: Diagnosis not present

## 2022-03-11 DIAGNOSIS — E1169 Type 2 diabetes mellitus with other specified complication: Secondary | ICD-10-CM | POA: Diagnosis not present

## 2022-03-11 DIAGNOSIS — R413 Other amnesia: Secondary | ICD-10-CM | POA: Diagnosis not present

## 2022-03-11 DIAGNOSIS — I482 Chronic atrial fibrillation, unspecified: Secondary | ICD-10-CM | POA: Diagnosis not present

## 2022-03-11 DIAGNOSIS — I110 Hypertensive heart disease with heart failure: Secondary | ICD-10-CM | POA: Diagnosis not present

## 2022-03-11 DIAGNOSIS — G4700 Insomnia, unspecified: Secondary | ICD-10-CM | POA: Diagnosis not present

## 2022-03-11 DIAGNOSIS — D509 Iron deficiency anemia, unspecified: Secondary | ICD-10-CM | POA: Diagnosis not present

## 2022-03-11 DIAGNOSIS — H6123 Impacted cerumen, bilateral: Secondary | ICD-10-CM | POA: Diagnosis not present

## 2022-03-11 DIAGNOSIS — N390 Urinary tract infection, site not specified: Secondary | ICD-10-CM | POA: Diagnosis not present

## 2022-03-11 DIAGNOSIS — E871 Hypo-osmolality and hyponatremia: Secondary | ICD-10-CM | POA: Diagnosis not present

## 2022-03-11 DIAGNOSIS — R69 Illness, unspecified: Secondary | ICD-10-CM | POA: Diagnosis not present

## 2022-03-11 DIAGNOSIS — E039 Hypothyroidism, unspecified: Secondary | ICD-10-CM | POA: Diagnosis not present

## 2022-03-11 DIAGNOSIS — I509 Heart failure, unspecified: Secondary | ICD-10-CM | POA: Diagnosis not present

## 2022-03-11 DIAGNOSIS — F419 Anxiety disorder, unspecified: Secondary | ICD-10-CM | POA: Diagnosis not present

## 2022-03-11 DIAGNOSIS — K589 Irritable bowel syndrome without diarrhea: Secondary | ICD-10-CM | POA: Diagnosis not present

## 2022-03-11 DIAGNOSIS — F32A Depression, unspecified: Secondary | ICD-10-CM | POA: Diagnosis not present

## 2022-03-11 DIAGNOSIS — Z7901 Long term (current) use of anticoagulants: Secondary | ICD-10-CM | POA: Diagnosis not present

## 2022-03-11 DIAGNOSIS — M1990 Unspecified osteoarthritis, unspecified site: Secondary | ICD-10-CM | POA: Diagnosis not present

## 2022-03-11 DIAGNOSIS — Z950 Presence of cardiac pacemaker: Secondary | ICD-10-CM | POA: Diagnosis not present

## 2022-03-11 DIAGNOSIS — G459 Transient cerebral ischemic attack, unspecified: Secondary | ICD-10-CM | POA: Diagnosis not present

## 2022-03-11 DIAGNOSIS — H900 Conductive hearing loss, bilateral: Secondary | ICD-10-CM | POA: Diagnosis not present

## 2022-03-12 DIAGNOSIS — E871 Hypo-osmolality and hyponatremia: Secondary | ICD-10-CM | POA: Diagnosis not present

## 2022-03-12 DIAGNOSIS — K219 Gastro-esophageal reflux disease without esophagitis: Secondary | ICD-10-CM | POA: Diagnosis not present

## 2022-03-12 DIAGNOSIS — I509 Heart failure, unspecified: Secondary | ICD-10-CM | POA: Diagnosis not present

## 2022-03-12 DIAGNOSIS — H900 Conductive hearing loss, bilateral: Secondary | ICD-10-CM | POA: Diagnosis not present

## 2022-03-12 DIAGNOSIS — I110 Hypertensive heart disease with heart failure: Secondary | ICD-10-CM | POA: Diagnosis not present

## 2022-03-12 DIAGNOSIS — E1169 Type 2 diabetes mellitus with other specified complication: Secondary | ICD-10-CM | POA: Diagnosis not present

## 2022-03-12 DIAGNOSIS — G459 Transient cerebral ischemic attack, unspecified: Secondary | ICD-10-CM | POA: Diagnosis not present

## 2022-03-12 DIAGNOSIS — Z7901 Long term (current) use of anticoagulants: Secondary | ICD-10-CM | POA: Diagnosis not present

## 2022-03-12 DIAGNOSIS — S82002D Unspecified fracture of left patella, subsequent encounter for closed fracture with routine healing: Secondary | ICD-10-CM | POA: Diagnosis not present

## 2022-03-12 DIAGNOSIS — H6123 Impacted cerumen, bilateral: Secondary | ICD-10-CM | POA: Diagnosis not present

## 2022-03-12 DIAGNOSIS — D509 Iron deficiency anemia, unspecified: Secondary | ICD-10-CM | POA: Diagnosis not present

## 2022-03-12 DIAGNOSIS — R413 Other amnesia: Secondary | ICD-10-CM | POA: Diagnosis not present

## 2022-03-12 DIAGNOSIS — N390 Urinary tract infection, site not specified: Secondary | ICD-10-CM | POA: Diagnosis not present

## 2022-03-12 DIAGNOSIS — Z950 Presence of cardiac pacemaker: Secondary | ICD-10-CM | POA: Diagnosis not present

## 2022-03-12 DIAGNOSIS — G4700 Insomnia, unspecified: Secondary | ICD-10-CM | POA: Diagnosis not present

## 2022-03-12 DIAGNOSIS — E7800 Pure hypercholesterolemia, unspecified: Secondary | ICD-10-CM | POA: Diagnosis not present

## 2022-03-12 DIAGNOSIS — R69 Illness, unspecified: Secondary | ICD-10-CM | POA: Diagnosis not present

## 2022-03-12 DIAGNOSIS — I482 Chronic atrial fibrillation, unspecified: Secondary | ICD-10-CM | POA: Diagnosis not present

## 2022-03-12 DIAGNOSIS — F32A Depression, unspecified: Secondary | ICD-10-CM | POA: Diagnosis not present

## 2022-03-12 DIAGNOSIS — K589 Irritable bowel syndrome without diarrhea: Secondary | ICD-10-CM | POA: Diagnosis not present

## 2022-03-12 DIAGNOSIS — E876 Hypokalemia: Secondary | ICD-10-CM | POA: Diagnosis not present

## 2022-03-12 DIAGNOSIS — M1990 Unspecified osteoarthritis, unspecified site: Secondary | ICD-10-CM | POA: Diagnosis not present

## 2022-03-12 DIAGNOSIS — F419 Anxiety disorder, unspecified: Secondary | ICD-10-CM | POA: Diagnosis not present

## 2022-03-12 DIAGNOSIS — B3731 Acute candidiasis of vulva and vagina: Secondary | ICD-10-CM | POA: Diagnosis not present

## 2022-03-12 DIAGNOSIS — E039 Hypothyroidism, unspecified: Secondary | ICD-10-CM | POA: Diagnosis not present

## 2022-03-13 DIAGNOSIS — R69 Illness, unspecified: Secondary | ICD-10-CM | POA: Diagnosis not present

## 2022-03-13 DIAGNOSIS — E876 Hypokalemia: Secondary | ICD-10-CM | POA: Diagnosis not present

## 2022-03-13 DIAGNOSIS — K219 Gastro-esophageal reflux disease without esophagitis: Secondary | ICD-10-CM | POA: Diagnosis not present

## 2022-03-13 DIAGNOSIS — I482 Chronic atrial fibrillation, unspecified: Secondary | ICD-10-CM | POA: Diagnosis not present

## 2022-03-13 DIAGNOSIS — E7800 Pure hypercholesterolemia, unspecified: Secondary | ICD-10-CM | POA: Diagnosis not present

## 2022-03-13 DIAGNOSIS — N390 Urinary tract infection, site not specified: Secondary | ICD-10-CM | POA: Diagnosis not present

## 2022-03-13 DIAGNOSIS — S82002D Unspecified fracture of left patella, subsequent encounter for closed fracture with routine healing: Secondary | ICD-10-CM | POA: Diagnosis not present

## 2022-03-13 DIAGNOSIS — E039 Hypothyroidism, unspecified: Secondary | ICD-10-CM | POA: Diagnosis not present

## 2022-03-13 DIAGNOSIS — R413 Other amnesia: Secondary | ICD-10-CM | POA: Diagnosis not present

## 2022-03-13 DIAGNOSIS — K589 Irritable bowel syndrome without diarrhea: Secondary | ICD-10-CM | POA: Diagnosis not present

## 2022-03-13 DIAGNOSIS — Z950 Presence of cardiac pacemaker: Secondary | ICD-10-CM | POA: Diagnosis not present

## 2022-03-13 DIAGNOSIS — G459 Transient cerebral ischemic attack, unspecified: Secondary | ICD-10-CM | POA: Diagnosis not present

## 2022-03-13 DIAGNOSIS — Z7901 Long term (current) use of anticoagulants: Secondary | ICD-10-CM | POA: Diagnosis not present

## 2022-03-13 DIAGNOSIS — F32A Depression, unspecified: Secondary | ICD-10-CM | POA: Diagnosis not present

## 2022-03-13 DIAGNOSIS — H6123 Impacted cerumen, bilateral: Secondary | ICD-10-CM | POA: Diagnosis not present

## 2022-03-13 DIAGNOSIS — E871 Hypo-osmolality and hyponatremia: Secondary | ICD-10-CM | POA: Diagnosis not present

## 2022-03-13 DIAGNOSIS — I509 Heart failure, unspecified: Secondary | ICD-10-CM | POA: Diagnosis not present

## 2022-03-13 DIAGNOSIS — M1990 Unspecified osteoarthritis, unspecified site: Secondary | ICD-10-CM | POA: Diagnosis not present

## 2022-03-13 DIAGNOSIS — E1169 Type 2 diabetes mellitus with other specified complication: Secondary | ICD-10-CM | POA: Diagnosis not present

## 2022-03-13 DIAGNOSIS — B3731 Acute candidiasis of vulva and vagina: Secondary | ICD-10-CM | POA: Diagnosis not present

## 2022-03-13 DIAGNOSIS — G4700 Insomnia, unspecified: Secondary | ICD-10-CM | POA: Diagnosis not present

## 2022-03-13 DIAGNOSIS — H900 Conductive hearing loss, bilateral: Secondary | ICD-10-CM | POA: Diagnosis not present

## 2022-03-13 DIAGNOSIS — D509 Iron deficiency anemia, unspecified: Secondary | ICD-10-CM | POA: Diagnosis not present

## 2022-03-13 DIAGNOSIS — F419 Anxiety disorder, unspecified: Secondary | ICD-10-CM | POA: Diagnosis not present

## 2022-03-13 DIAGNOSIS — I110 Hypertensive heart disease with heart failure: Secondary | ICD-10-CM | POA: Diagnosis not present

## 2022-03-14 ENCOUNTER — Other Ambulatory Visit: Payer: Self-pay | Admitting: Cardiology

## 2022-03-14 DIAGNOSIS — E871 Hypo-osmolality and hyponatremia: Secondary | ICD-10-CM | POA: Diagnosis not present

## 2022-03-14 DIAGNOSIS — B3731 Acute candidiasis of vulva and vagina: Secondary | ICD-10-CM | POA: Diagnosis not present

## 2022-03-14 DIAGNOSIS — K589 Irritable bowel syndrome without diarrhea: Secondary | ICD-10-CM | POA: Diagnosis not present

## 2022-03-14 DIAGNOSIS — G4700 Insomnia, unspecified: Secondary | ICD-10-CM | POA: Diagnosis not present

## 2022-03-14 DIAGNOSIS — E876 Hypokalemia: Secondary | ICD-10-CM | POA: Diagnosis not present

## 2022-03-14 DIAGNOSIS — N390 Urinary tract infection, site not specified: Secondary | ICD-10-CM | POA: Diagnosis not present

## 2022-03-14 DIAGNOSIS — D509 Iron deficiency anemia, unspecified: Secondary | ICD-10-CM | POA: Diagnosis not present

## 2022-03-14 DIAGNOSIS — H900 Conductive hearing loss, bilateral: Secondary | ICD-10-CM | POA: Diagnosis not present

## 2022-03-14 DIAGNOSIS — F32A Depression, unspecified: Secondary | ICD-10-CM | POA: Diagnosis not present

## 2022-03-14 DIAGNOSIS — G459 Transient cerebral ischemic attack, unspecified: Secondary | ICD-10-CM | POA: Diagnosis not present

## 2022-03-14 DIAGNOSIS — R69 Illness, unspecified: Secondary | ICD-10-CM | POA: Diagnosis not present

## 2022-03-14 DIAGNOSIS — R413 Other amnesia: Secondary | ICD-10-CM | POA: Diagnosis not present

## 2022-03-14 DIAGNOSIS — I110 Hypertensive heart disease with heart failure: Secondary | ICD-10-CM | POA: Diagnosis not present

## 2022-03-14 DIAGNOSIS — E7800 Pure hypercholesterolemia, unspecified: Secondary | ICD-10-CM | POA: Diagnosis not present

## 2022-03-14 DIAGNOSIS — H6123 Impacted cerumen, bilateral: Secondary | ICD-10-CM | POA: Diagnosis not present

## 2022-03-14 DIAGNOSIS — E039 Hypothyroidism, unspecified: Secondary | ICD-10-CM | POA: Diagnosis not present

## 2022-03-14 DIAGNOSIS — M1990 Unspecified osteoarthritis, unspecified site: Secondary | ICD-10-CM | POA: Diagnosis not present

## 2022-03-14 DIAGNOSIS — E1169 Type 2 diabetes mellitus with other specified complication: Secondary | ICD-10-CM | POA: Diagnosis not present

## 2022-03-14 DIAGNOSIS — S82002D Unspecified fracture of left patella, subsequent encounter for closed fracture with routine healing: Secondary | ICD-10-CM | POA: Diagnosis not present

## 2022-03-14 DIAGNOSIS — I482 Chronic atrial fibrillation, unspecified: Secondary | ICD-10-CM | POA: Diagnosis not present

## 2022-03-14 DIAGNOSIS — K219 Gastro-esophageal reflux disease without esophagitis: Secondary | ICD-10-CM | POA: Diagnosis not present

## 2022-03-14 DIAGNOSIS — I509 Heart failure, unspecified: Secondary | ICD-10-CM | POA: Diagnosis not present

## 2022-03-14 DIAGNOSIS — Z950 Presence of cardiac pacemaker: Secondary | ICD-10-CM | POA: Diagnosis not present

## 2022-03-14 DIAGNOSIS — Z7901 Long term (current) use of anticoagulants: Secondary | ICD-10-CM | POA: Diagnosis not present

## 2022-03-14 DIAGNOSIS — F419 Anxiety disorder, unspecified: Secondary | ICD-10-CM | POA: Diagnosis not present

## 2022-03-18 DIAGNOSIS — G459 Transient cerebral ischemic attack, unspecified: Secondary | ICD-10-CM | POA: Diagnosis not present

## 2022-03-18 DIAGNOSIS — R69 Illness, unspecified: Secondary | ICD-10-CM | POA: Diagnosis not present

## 2022-03-18 DIAGNOSIS — K219 Gastro-esophageal reflux disease without esophagitis: Secondary | ICD-10-CM | POA: Diagnosis not present

## 2022-03-18 DIAGNOSIS — G4700 Insomnia, unspecified: Secondary | ICD-10-CM | POA: Diagnosis not present

## 2022-03-18 DIAGNOSIS — I110 Hypertensive heart disease with heart failure: Secondary | ICD-10-CM | POA: Diagnosis not present

## 2022-03-18 DIAGNOSIS — B3731 Acute candidiasis of vulva and vagina: Secondary | ICD-10-CM | POA: Diagnosis not present

## 2022-03-18 DIAGNOSIS — I482 Chronic atrial fibrillation, unspecified: Secondary | ICD-10-CM | POA: Diagnosis not present

## 2022-03-18 DIAGNOSIS — E871 Hypo-osmolality and hyponatremia: Secondary | ICD-10-CM | POA: Diagnosis not present

## 2022-03-18 DIAGNOSIS — Z950 Presence of cardiac pacemaker: Secondary | ICD-10-CM | POA: Diagnosis not present

## 2022-03-18 DIAGNOSIS — S82002D Unspecified fracture of left patella, subsequent encounter for closed fracture with routine healing: Secondary | ICD-10-CM | POA: Diagnosis not present

## 2022-03-18 DIAGNOSIS — D509 Iron deficiency anemia, unspecified: Secondary | ICD-10-CM | POA: Diagnosis not present

## 2022-03-18 DIAGNOSIS — R413 Other amnesia: Secondary | ICD-10-CM | POA: Diagnosis not present

## 2022-03-18 DIAGNOSIS — E876 Hypokalemia: Secondary | ICD-10-CM | POA: Diagnosis not present

## 2022-03-18 DIAGNOSIS — K589 Irritable bowel syndrome without diarrhea: Secondary | ICD-10-CM | POA: Diagnosis not present

## 2022-03-18 DIAGNOSIS — E7800 Pure hypercholesterolemia, unspecified: Secondary | ICD-10-CM | POA: Diagnosis not present

## 2022-03-18 DIAGNOSIS — E039 Hypothyroidism, unspecified: Secondary | ICD-10-CM | POA: Diagnosis not present

## 2022-03-18 DIAGNOSIS — H6123 Impacted cerumen, bilateral: Secondary | ICD-10-CM | POA: Diagnosis not present

## 2022-03-18 DIAGNOSIS — I509 Heart failure, unspecified: Secondary | ICD-10-CM | POA: Diagnosis not present

## 2022-03-18 DIAGNOSIS — N390 Urinary tract infection, site not specified: Secondary | ICD-10-CM | POA: Diagnosis not present

## 2022-03-18 DIAGNOSIS — Z7901 Long term (current) use of anticoagulants: Secondary | ICD-10-CM | POA: Diagnosis not present

## 2022-03-18 DIAGNOSIS — F32A Depression, unspecified: Secondary | ICD-10-CM | POA: Diagnosis not present

## 2022-03-18 DIAGNOSIS — F419 Anxiety disorder, unspecified: Secondary | ICD-10-CM | POA: Diagnosis not present

## 2022-03-18 DIAGNOSIS — E1169 Type 2 diabetes mellitus with other specified complication: Secondary | ICD-10-CM | POA: Diagnosis not present

## 2022-03-18 DIAGNOSIS — M1990 Unspecified osteoarthritis, unspecified site: Secondary | ICD-10-CM | POA: Diagnosis not present

## 2022-03-18 DIAGNOSIS — H900 Conductive hearing loss, bilateral: Secondary | ICD-10-CM | POA: Diagnosis not present

## 2022-03-21 DIAGNOSIS — B3731 Acute candidiasis of vulva and vagina: Secondary | ICD-10-CM | POA: Diagnosis not present

## 2022-03-21 DIAGNOSIS — F32A Depression, unspecified: Secondary | ICD-10-CM | POA: Diagnosis not present

## 2022-03-21 DIAGNOSIS — I482 Chronic atrial fibrillation, unspecified: Secondary | ICD-10-CM | POA: Diagnosis not present

## 2022-03-21 DIAGNOSIS — E039 Hypothyroidism, unspecified: Secondary | ICD-10-CM | POA: Diagnosis not present

## 2022-03-21 DIAGNOSIS — K589 Irritable bowel syndrome without diarrhea: Secondary | ICD-10-CM | POA: Diagnosis not present

## 2022-03-21 DIAGNOSIS — G4700 Insomnia, unspecified: Secondary | ICD-10-CM | POA: Diagnosis not present

## 2022-03-21 DIAGNOSIS — E1169 Type 2 diabetes mellitus with other specified complication: Secondary | ICD-10-CM | POA: Diagnosis not present

## 2022-03-21 DIAGNOSIS — D509 Iron deficiency anemia, unspecified: Secondary | ICD-10-CM | POA: Diagnosis not present

## 2022-03-21 DIAGNOSIS — E7800 Pure hypercholesterolemia, unspecified: Secondary | ICD-10-CM | POA: Diagnosis not present

## 2022-03-21 DIAGNOSIS — G459 Transient cerebral ischemic attack, unspecified: Secondary | ICD-10-CM | POA: Diagnosis not present

## 2022-03-21 DIAGNOSIS — M1990 Unspecified osteoarthritis, unspecified site: Secondary | ICD-10-CM | POA: Diagnosis not present

## 2022-03-21 DIAGNOSIS — I110 Hypertensive heart disease with heart failure: Secondary | ICD-10-CM | POA: Diagnosis not present

## 2022-03-21 DIAGNOSIS — E871 Hypo-osmolality and hyponatremia: Secondary | ICD-10-CM | POA: Diagnosis not present

## 2022-03-21 DIAGNOSIS — R413 Other amnesia: Secondary | ICD-10-CM | POA: Diagnosis not present

## 2022-03-21 DIAGNOSIS — Z950 Presence of cardiac pacemaker: Secondary | ICD-10-CM | POA: Diagnosis not present

## 2022-03-21 DIAGNOSIS — S82002D Unspecified fracture of left patella, subsequent encounter for closed fracture with routine healing: Secondary | ICD-10-CM | POA: Diagnosis not present

## 2022-03-21 DIAGNOSIS — N390 Urinary tract infection, site not specified: Secondary | ICD-10-CM | POA: Diagnosis not present

## 2022-03-21 DIAGNOSIS — K219 Gastro-esophageal reflux disease without esophagitis: Secondary | ICD-10-CM | POA: Diagnosis not present

## 2022-03-21 DIAGNOSIS — R69 Illness, unspecified: Secondary | ICD-10-CM | POA: Diagnosis not present

## 2022-03-21 DIAGNOSIS — F419 Anxiety disorder, unspecified: Secondary | ICD-10-CM | POA: Diagnosis not present

## 2022-03-21 DIAGNOSIS — E876 Hypokalemia: Secondary | ICD-10-CM | POA: Diagnosis not present

## 2022-03-21 DIAGNOSIS — Z7901 Long term (current) use of anticoagulants: Secondary | ICD-10-CM | POA: Diagnosis not present

## 2022-03-21 DIAGNOSIS — I509 Heart failure, unspecified: Secondary | ICD-10-CM | POA: Diagnosis not present

## 2022-03-21 DIAGNOSIS — H900 Conductive hearing loss, bilateral: Secondary | ICD-10-CM | POA: Diagnosis not present

## 2022-03-21 DIAGNOSIS — H6123 Impacted cerumen, bilateral: Secondary | ICD-10-CM | POA: Diagnosis not present

## 2022-03-24 DIAGNOSIS — E039 Hypothyroidism, unspecified: Secondary | ICD-10-CM | POA: Diagnosis not present

## 2022-03-24 DIAGNOSIS — H6123 Impacted cerumen, bilateral: Secondary | ICD-10-CM | POA: Diagnosis not present

## 2022-03-24 DIAGNOSIS — Z7901 Long term (current) use of anticoagulants: Secondary | ICD-10-CM | POA: Diagnosis not present

## 2022-03-24 DIAGNOSIS — G459 Transient cerebral ischemic attack, unspecified: Secondary | ICD-10-CM | POA: Diagnosis not present

## 2022-03-24 DIAGNOSIS — E871 Hypo-osmolality and hyponatremia: Secondary | ICD-10-CM | POA: Diagnosis not present

## 2022-03-24 DIAGNOSIS — E1169 Type 2 diabetes mellitus with other specified complication: Secondary | ICD-10-CM | POA: Diagnosis not present

## 2022-03-24 DIAGNOSIS — K589 Irritable bowel syndrome without diarrhea: Secondary | ICD-10-CM | POA: Diagnosis not present

## 2022-03-24 DIAGNOSIS — B3731 Acute candidiasis of vulva and vagina: Secondary | ICD-10-CM | POA: Diagnosis not present

## 2022-03-24 DIAGNOSIS — I110 Hypertensive heart disease with heart failure: Secondary | ICD-10-CM | POA: Diagnosis not present

## 2022-03-24 DIAGNOSIS — M1990 Unspecified osteoarthritis, unspecified site: Secondary | ICD-10-CM | POA: Diagnosis not present

## 2022-03-24 DIAGNOSIS — N390 Urinary tract infection, site not specified: Secondary | ICD-10-CM | POA: Diagnosis not present

## 2022-03-24 DIAGNOSIS — F32A Depression, unspecified: Secondary | ICD-10-CM | POA: Diagnosis not present

## 2022-03-24 DIAGNOSIS — F419 Anxiety disorder, unspecified: Secondary | ICD-10-CM | POA: Diagnosis not present

## 2022-03-24 DIAGNOSIS — G4700 Insomnia, unspecified: Secondary | ICD-10-CM | POA: Diagnosis not present

## 2022-03-24 DIAGNOSIS — H900 Conductive hearing loss, bilateral: Secondary | ICD-10-CM | POA: Diagnosis not present

## 2022-03-24 DIAGNOSIS — Z950 Presence of cardiac pacemaker: Secondary | ICD-10-CM | POA: Diagnosis not present

## 2022-03-24 DIAGNOSIS — D509 Iron deficiency anemia, unspecified: Secondary | ICD-10-CM | POA: Diagnosis not present

## 2022-03-24 DIAGNOSIS — I509 Heart failure, unspecified: Secondary | ICD-10-CM | POA: Diagnosis not present

## 2022-03-24 DIAGNOSIS — E7800 Pure hypercholesterolemia, unspecified: Secondary | ICD-10-CM | POA: Diagnosis not present

## 2022-03-24 DIAGNOSIS — R69 Illness, unspecified: Secondary | ICD-10-CM | POA: Diagnosis not present

## 2022-03-24 DIAGNOSIS — E876 Hypokalemia: Secondary | ICD-10-CM | POA: Diagnosis not present

## 2022-03-24 DIAGNOSIS — K219 Gastro-esophageal reflux disease without esophagitis: Secondary | ICD-10-CM | POA: Diagnosis not present

## 2022-03-24 DIAGNOSIS — S82002D Unspecified fracture of left patella, subsequent encounter for closed fracture with routine healing: Secondary | ICD-10-CM | POA: Diagnosis not present

## 2022-03-24 DIAGNOSIS — R413 Other amnesia: Secondary | ICD-10-CM | POA: Diagnosis not present

## 2022-03-24 DIAGNOSIS — I482 Chronic atrial fibrillation, unspecified: Secondary | ICD-10-CM | POA: Diagnosis not present

## 2022-03-26 DIAGNOSIS — E1169 Type 2 diabetes mellitus with other specified complication: Secondary | ICD-10-CM | POA: Diagnosis not present

## 2022-03-26 DIAGNOSIS — Z950 Presence of cardiac pacemaker: Secondary | ICD-10-CM | POA: Diagnosis not present

## 2022-03-26 DIAGNOSIS — B3731 Acute candidiasis of vulva and vagina: Secondary | ICD-10-CM | POA: Diagnosis not present

## 2022-03-26 DIAGNOSIS — E876 Hypokalemia: Secondary | ICD-10-CM | POA: Diagnosis not present

## 2022-03-26 DIAGNOSIS — R69 Illness, unspecified: Secondary | ICD-10-CM | POA: Diagnosis not present

## 2022-03-26 DIAGNOSIS — H900 Conductive hearing loss, bilateral: Secondary | ICD-10-CM | POA: Diagnosis not present

## 2022-03-26 DIAGNOSIS — K589 Irritable bowel syndrome without diarrhea: Secondary | ICD-10-CM | POA: Diagnosis not present

## 2022-03-26 DIAGNOSIS — N390 Urinary tract infection, site not specified: Secondary | ICD-10-CM | POA: Diagnosis not present

## 2022-03-26 DIAGNOSIS — D509 Iron deficiency anemia, unspecified: Secondary | ICD-10-CM | POA: Diagnosis not present

## 2022-03-26 DIAGNOSIS — E7800 Pure hypercholesterolemia, unspecified: Secondary | ICD-10-CM | POA: Diagnosis not present

## 2022-03-26 DIAGNOSIS — F32A Depression, unspecified: Secondary | ICD-10-CM | POA: Diagnosis not present

## 2022-03-26 DIAGNOSIS — I482 Chronic atrial fibrillation, unspecified: Secondary | ICD-10-CM | POA: Diagnosis not present

## 2022-03-26 DIAGNOSIS — K219 Gastro-esophageal reflux disease without esophagitis: Secondary | ICD-10-CM | POA: Diagnosis not present

## 2022-03-26 DIAGNOSIS — H6123 Impacted cerumen, bilateral: Secondary | ICD-10-CM | POA: Diagnosis not present

## 2022-03-26 DIAGNOSIS — Z7901 Long term (current) use of anticoagulants: Secondary | ICD-10-CM | POA: Diagnosis not present

## 2022-03-26 DIAGNOSIS — R413 Other amnesia: Secondary | ICD-10-CM | POA: Diagnosis not present

## 2022-03-26 DIAGNOSIS — S82002D Unspecified fracture of left patella, subsequent encounter for closed fracture with routine healing: Secondary | ICD-10-CM | POA: Diagnosis not present

## 2022-03-26 DIAGNOSIS — M1990 Unspecified osteoarthritis, unspecified site: Secondary | ICD-10-CM | POA: Diagnosis not present

## 2022-03-26 DIAGNOSIS — F419 Anxiety disorder, unspecified: Secondary | ICD-10-CM | POA: Diagnosis not present

## 2022-03-26 DIAGNOSIS — G459 Transient cerebral ischemic attack, unspecified: Secondary | ICD-10-CM | POA: Diagnosis not present

## 2022-03-26 DIAGNOSIS — E039 Hypothyroidism, unspecified: Secondary | ICD-10-CM | POA: Diagnosis not present

## 2022-03-26 DIAGNOSIS — E871 Hypo-osmolality and hyponatremia: Secondary | ICD-10-CM | POA: Diagnosis not present

## 2022-03-26 DIAGNOSIS — G4700 Insomnia, unspecified: Secondary | ICD-10-CM | POA: Diagnosis not present

## 2022-03-26 DIAGNOSIS — I509 Heart failure, unspecified: Secondary | ICD-10-CM | POA: Diagnosis not present

## 2022-03-26 DIAGNOSIS — I110 Hypertensive heart disease with heart failure: Secondary | ICD-10-CM | POA: Diagnosis not present

## 2022-03-28 DIAGNOSIS — R69 Illness, unspecified: Secondary | ICD-10-CM | POA: Diagnosis not present

## 2022-03-28 DIAGNOSIS — I509 Heart failure, unspecified: Secondary | ICD-10-CM | POA: Diagnosis not present

## 2022-03-28 DIAGNOSIS — H900 Conductive hearing loss, bilateral: Secondary | ICD-10-CM | POA: Diagnosis not present

## 2022-03-28 DIAGNOSIS — I110 Hypertensive heart disease with heart failure: Secondary | ICD-10-CM | POA: Diagnosis not present

## 2022-03-28 DIAGNOSIS — D509 Iron deficiency anemia, unspecified: Secondary | ICD-10-CM | POA: Diagnosis not present

## 2022-03-28 DIAGNOSIS — B3731 Acute candidiasis of vulva and vagina: Secondary | ICD-10-CM | POA: Diagnosis not present

## 2022-03-28 DIAGNOSIS — F32A Depression, unspecified: Secondary | ICD-10-CM | POA: Diagnosis not present

## 2022-03-28 DIAGNOSIS — M1990 Unspecified osteoarthritis, unspecified site: Secondary | ICD-10-CM | POA: Diagnosis not present

## 2022-03-28 DIAGNOSIS — N390 Urinary tract infection, site not specified: Secondary | ICD-10-CM | POA: Diagnosis not present

## 2022-03-28 DIAGNOSIS — K589 Irritable bowel syndrome without diarrhea: Secondary | ICD-10-CM | POA: Diagnosis not present

## 2022-03-28 DIAGNOSIS — F419 Anxiety disorder, unspecified: Secondary | ICD-10-CM | POA: Diagnosis not present

## 2022-03-28 DIAGNOSIS — I482 Chronic atrial fibrillation, unspecified: Secondary | ICD-10-CM | POA: Diagnosis not present

## 2022-03-28 DIAGNOSIS — R413 Other amnesia: Secondary | ICD-10-CM | POA: Diagnosis not present

## 2022-03-28 DIAGNOSIS — E1169 Type 2 diabetes mellitus with other specified complication: Secondary | ICD-10-CM | POA: Diagnosis not present

## 2022-03-28 DIAGNOSIS — Z950 Presence of cardiac pacemaker: Secondary | ICD-10-CM | POA: Diagnosis not present

## 2022-03-28 DIAGNOSIS — H6123 Impacted cerumen, bilateral: Secondary | ICD-10-CM | POA: Diagnosis not present

## 2022-03-28 DIAGNOSIS — E7800 Pure hypercholesterolemia, unspecified: Secondary | ICD-10-CM | POA: Diagnosis not present

## 2022-03-28 DIAGNOSIS — Z7901 Long term (current) use of anticoagulants: Secondary | ICD-10-CM | POA: Diagnosis not present

## 2022-03-28 DIAGNOSIS — G4700 Insomnia, unspecified: Secondary | ICD-10-CM | POA: Diagnosis not present

## 2022-03-28 DIAGNOSIS — K219 Gastro-esophageal reflux disease without esophagitis: Secondary | ICD-10-CM | POA: Diagnosis not present

## 2022-03-28 DIAGNOSIS — E039 Hypothyroidism, unspecified: Secondary | ICD-10-CM | POA: Diagnosis not present

## 2022-03-28 DIAGNOSIS — S82002D Unspecified fracture of left patella, subsequent encounter for closed fracture with routine healing: Secondary | ICD-10-CM | POA: Diagnosis not present

## 2022-03-28 DIAGNOSIS — E871 Hypo-osmolality and hyponatremia: Secondary | ICD-10-CM | POA: Diagnosis not present

## 2022-03-28 DIAGNOSIS — G459 Transient cerebral ischemic attack, unspecified: Secondary | ICD-10-CM | POA: Diagnosis not present

## 2022-03-28 DIAGNOSIS — E876 Hypokalemia: Secondary | ICD-10-CM | POA: Diagnosis not present

## 2022-04-07 DIAGNOSIS — S82035A Nondisplaced transverse fracture of left patella, initial encounter for closed fracture: Secondary | ICD-10-CM | POA: Diagnosis not present

## 2022-04-15 DIAGNOSIS — R262 Difficulty in walking, not elsewhere classified: Secondary | ICD-10-CM | POA: Diagnosis not present

## 2022-04-15 DIAGNOSIS — E785 Hyperlipidemia, unspecified: Secondary | ICD-10-CM | POA: Diagnosis not present

## 2022-04-15 DIAGNOSIS — E039 Hypothyroidism, unspecified: Secondary | ICD-10-CM | POA: Diagnosis not present

## 2022-04-15 DIAGNOSIS — I10 Essential (primary) hypertension: Secondary | ICD-10-CM | POA: Diagnosis not present

## 2022-04-15 DIAGNOSIS — I482 Chronic atrial fibrillation, unspecified: Secondary | ICD-10-CM | POA: Diagnosis not present

## 2022-04-15 DIAGNOSIS — E1169 Type 2 diabetes mellitus with other specified complication: Secondary | ICD-10-CM | POA: Diagnosis not present

## 2022-04-15 DIAGNOSIS — R413 Other amnesia: Secondary | ICD-10-CM | POA: Diagnosis not present

## 2022-04-15 DIAGNOSIS — R69 Illness, unspecified: Secondary | ICD-10-CM | POA: Diagnosis not present

## 2022-04-15 DIAGNOSIS — Z8719 Personal history of other diseases of the digestive system: Secondary | ICD-10-CM | POA: Diagnosis not present

## 2022-04-21 ENCOUNTER — Ambulatory Visit: Payer: Medicare HMO | Admitting: Cardiology

## 2022-04-30 DIAGNOSIS — M25511 Pain in right shoulder: Secondary | ICD-10-CM | POA: Diagnosis not present

## 2022-05-05 ENCOUNTER — Ambulatory Visit: Payer: Medicare HMO | Admitting: Cardiology

## 2022-05-05 ENCOUNTER — Encounter: Payer: Self-pay | Admitting: Cardiology

## 2022-05-05 VITALS — BP 134/80 | HR 97 | Ht 63.0 in | Wt 134.2 lb

## 2022-05-05 DIAGNOSIS — I10 Essential (primary) hypertension: Secondary | ICD-10-CM | POA: Diagnosis not present

## 2022-05-05 DIAGNOSIS — Z950 Presence of cardiac pacemaker: Secondary | ICD-10-CM

## 2022-05-05 DIAGNOSIS — I4821 Permanent atrial fibrillation: Secondary | ICD-10-CM | POA: Diagnosis not present

## 2022-05-05 DIAGNOSIS — E782 Mixed hyperlipidemia: Secondary | ICD-10-CM | POA: Diagnosis not present

## 2022-05-05 NOTE — Patient Instructions (Signed)

## 2022-05-05 NOTE — Progress Notes (Signed)
Cardiology Office Note:    Date:  05/05/2022   ID:  Brittany Kramer, DOB 1926/03/13, MRN [id number]  PCP:  Brittany Dandy, NP  Cardiologist:  Brittany Lindau, MD   Referring MD: Brittany Dandy, NP    ASSESSMENT:    1. Permanent atrial fibrillation ([place])   2. History of pacemaker   3. Mixed hyperlipidemia   4. Hypertension, unspecified type    PLAN:    In order of problems listed above:  Primary prevention stressed with the patient.  Importance of compliance with diet medication stressed and she vocalized understanding. Permanent atrial fibrillation:I discussed with the patient atrial fibrillation, disease process. Management and therapy including rate and rhythm control, anticoagulation benefits and potential risks were discussed extensively with the patient. Patient had multiple questions which were answered to patient's satisfaction. Essential hypertension: Blood pressure stable and she takes her medications regularly. Mixed dyslipidemia: On lipid-lowering therapy and doing well. Patient will be seen in follow-up appointment in 6 months or earlier if the patient has any concerns    Medication Adjustments/Labs and Tests Ordered: Current medicines are reviewed at length with the patient today.  Concerns regarding medicines are outlined above.  No orders of the defined types were placed in this encounter.  No orders of the defined types were placed in this encounter.    No chief complaint on file.    History of Present Illness:    Brittany Kramer is a 88 y.o. female.  Patient has past medical history of permanent atrial fibrillation, congestive heart failure, cardiomyopathy and mixed dyslipidemia.  She denies any problems at this time and takes care of activities of daily living.  No chest pain orthopnea or PND.  At the time of my evaluation, the patient is alert awake oriented and in no distress.  She ambulates age appropriately.  Past Medical History:  Diagnosis Date   Acute  blood loss anemia 08/04/2015   Acute GI bleeding 08/04/2015   Arrhythmia    Atrial fibrillation ([place])    Benign essential hypertension 10/13/2018   Breast cancer ([place])    Cancer ([place])    CHF (congestive heart failure) ([place])    Diabetes mellitus without complication ([place])    Diverticulitis    Diverticulosis 08/04/2015   GERD (gastroesophageal reflux disease)    Gestational diabetes, diet controlled    History of pacemaker 08/04/2015   HLD (hyperlipidemia) 08/04/2015   HTN (hypertension) 08/04/2015   Hyperlipidemia    Hypertension    Hypothyroidism    Iron deficiency anemia 08/04/2015   Permanent atrial fibrillation ([place]) 03/04/2021   SSS (sick sinus syndrome) ([place]) 08/14/2016   Thyroid disease     Past Surgical History:  Procedure Laterality Date   COLONOSCOPY  2004   ESOPHAGOGASTRODUODENOSCOPY N/A 08/04/2015   Procedure: ESOPHAGOGASTRODUODENOSCOPY (EGD);  Surgeon: [name], MD;  Location: [facility] ENDOSCOPY;  Service: Endoscopy;  Laterality: N/A;   INSERT / REPLACE / REMOVE PACEMAKER      Current Medications: Current Meds  Medication Sig   atorvastatin (LIPITOR) 40 MG tablet Take 40 mg by mouth daily.   ELIQUIS 2.5 MG TABS tablet Take 2.5 mg by mouth 2 (two) times daily.   furosemide (LASIX) 40 MG tablet Take 40 mg by mouth daily.   levothyroxine (SYNTHROID, LEVOTHROID) 100 MCG tablet Take 100 mcg by mouth daily.   metoprolol succinate (TOPROL-XL) 25 MG 24 hr tablet Take 1 tablet (25 mg total) by mouth daily. TAKE WITH OR IMMEDIATELY FOLLOWING A MEAL.   pantoprazole (  PROTONIX) 40 MG tablet Take 1 tablet (40 mg total) by mouth daily.   potassium chloride SA (K-DUR,KLOR-CON) 20 MEQ tablet Take 40 mEq by mouth 2 (two) times daily.    QUEtiapine (SEROQUEL) 50 MG tablet Take 50 mg by mouth daily.    sertraline (ZOLOFT) 100 MG tablet Take 100 mg by mouth daily.     Allergies:   Tramadol   Social History   Socioeconomic History   Marital status: Married    Spouse name: Not on file   Number of  children: Not on file   Years of education: Not on file   Highest education level: Not on file  Occupational History   Not on file  Tobacco Use   Smoking status: Never   Smokeless tobacco: Never  Substance and Sexual Activity   Alcohol use: No   Drug use: No   Sexual activity: Not on file  Other Topics Concern   Not on file  Social History Narrative   Not on file   Social Determinants of Health   Financial Resource Strain: Not on file  Food Insecurity: Not on file  Transportation Needs: Not on file  Physical Activity: Not on file  Stress: Not on file  Social Connections: Not on file     Family History: The patient's family history includes AAA (abdominal aortic aneurysm) in her brother and mother; Heart disease in her father.  ROS:   Please see the history of present illness.    All other systems reviewed and are negative.  EKGs/Labs/Other Studies Reviewed:    The following studies were reviewed today: EKG reveals atrial fibrillation with well-controlled ventricular rate.   Recent Labs: No results found for requested labs within last 8760 hours.  Recent Lipid Panel No results found for: CHOL, TRIG, HDL, CHOLHDL, VLDL, LDLCALC, LDLDIRECT  Physical Exam:    VS:  BP 134/80   Pulse 97   Ht '5\' 3"'$  (1.6 m)   Wt 134 lb 3.2 oz (60.9 kg)   LMP  (LMP Unknown)   SpO2 98%   BMI 23.77 kg/m     Wt Readings from Last 3 Encounters:  05/05/22 134 lb 3.2 oz (60.9 kg)  09/11/21 140 lb 6.4 oz (63.7 kg)  03/04/21 148 lb 6.4 oz (67.3 kg)     GEN: Patient is in no acute distress HEENT: Normal NECK: No JVD; No carotid bruits LYMPHATICS: No lymphadenopathy CARDIAC: Hear sounds regular, 2/6 systolic murmur at the apex. RESPIRATORY:  Clear to auscultation without rales, wheezing or rhonchi  ABDOMEN: Soft, non-tender, non-distended MUSCULOSKELETAL:  No edema; No deformity  SKIN: Warm and dry NEUROLOGIC:  Alert and oriented x 3 PSYCHIATRIC:  Normal affect   Signed, Brittany Lindau, MD  05/05/2022 2:16 PM    [facility]

## 2022-07-14 DIAGNOSIS — E785 Hyperlipidemia, unspecified: Secondary | ICD-10-CM | POA: Diagnosis not present

## 2022-07-14 DIAGNOSIS — E1169 Type 2 diabetes mellitus with other specified complication: Secondary | ICD-10-CM | POA: Diagnosis not present

## 2022-07-14 DIAGNOSIS — R262 Difficulty in walking, not elsewhere classified: Secondary | ICD-10-CM | POA: Diagnosis not present

## 2022-07-14 DIAGNOSIS — R634 Abnormal weight loss: Secondary | ICD-10-CM | POA: Diagnosis not present

## 2022-07-14 DIAGNOSIS — Z8719 Personal history of other diseases of the digestive system: Secondary | ICD-10-CM | POA: Diagnosis not present

## 2022-07-14 DIAGNOSIS — I10 Essential (primary) hypertension: Secondary | ICD-10-CM | POA: Diagnosis not present

## 2022-07-14 DIAGNOSIS — Z9181 History of falling: Secondary | ICD-10-CM | POA: Diagnosis not present

## 2022-07-14 DIAGNOSIS — E039 Hypothyroidism, unspecified: Secondary | ICD-10-CM | POA: Diagnosis not present

## 2022-07-14 DIAGNOSIS — R413 Other amnesia: Secondary | ICD-10-CM | POA: Diagnosis not present

## 2022-07-14 DIAGNOSIS — Z139 Encounter for screening, unspecified: Secondary | ICD-10-CM | POA: Diagnosis not present

## 2022-07-14 DIAGNOSIS — I482 Chronic atrial fibrillation, unspecified: Secondary | ICD-10-CM | POA: Diagnosis not present

## 2022-07-14 DIAGNOSIS — R69 Illness, unspecified: Secondary | ICD-10-CM | POA: Diagnosis not present

## 2022-07-31 DIAGNOSIS — E875 Hyperkalemia: Secondary | ICD-10-CM | POA: Diagnosis not present

## 2022-08-06 DIAGNOSIS — M25511 Pain in right shoulder: Secondary | ICD-10-CM | POA: Diagnosis not present

## 2022-08-26 DIAGNOSIS — H61303 Acquired stenosis of external ear canal, unspecified, bilateral: Secondary | ICD-10-CM | POA: Diagnosis not present

## 2022-08-26 DIAGNOSIS — H6123 Impacted cerumen, bilateral: Secondary | ICD-10-CM | POA: Diagnosis not present

## 2022-08-26 DIAGNOSIS — H9193 Unspecified hearing loss, bilateral: Secondary | ICD-10-CM | POA: Diagnosis not present

## 2022-09-22 DIAGNOSIS — R410 Disorientation, unspecified: Secondary | ICD-10-CM | POA: Diagnosis not present

## 2022-09-22 DIAGNOSIS — N390 Urinary tract infection, site not specified: Secondary | ICD-10-CM | POA: Diagnosis not present

## 2022-09-22 DIAGNOSIS — R918 Other nonspecific abnormal finding of lung field: Secondary | ICD-10-CM | POA: Diagnosis not present

## 2022-09-22 DIAGNOSIS — F419 Anxiety disorder, unspecified: Secondary | ICD-10-CM | POA: Diagnosis not present

## 2022-09-22 DIAGNOSIS — Z743 Need for continuous supervision: Secondary | ICD-10-CM | POA: Diagnosis not present

## 2022-09-22 DIAGNOSIS — F0390 Unspecified dementia without behavioral disturbance: Secondary | ICD-10-CM | POA: Diagnosis not present

## 2022-09-22 DIAGNOSIS — E039 Hypothyroidism, unspecified: Secondary | ICD-10-CM | POA: Diagnosis not present

## 2022-09-22 DIAGNOSIS — R5381 Other malaise: Secondary | ICD-10-CM | POA: Diagnosis not present

## 2022-09-22 DIAGNOSIS — K589 Irritable bowel syndrome without diarrhea: Secondary | ICD-10-CM | POA: Diagnosis not present

## 2022-09-22 DIAGNOSIS — G9341 Metabolic encephalopathy: Secondary | ICD-10-CM | POA: Diagnosis not present

## 2022-09-22 DIAGNOSIS — W19XXXA Unspecified fall, initial encounter: Secondary | ICD-10-CM | POA: Diagnosis not present

## 2022-09-22 DIAGNOSIS — Z681 Body mass index (BMI) 19 or less, adult: Secondary | ICD-10-CM | POA: Diagnosis not present

## 2022-09-22 DIAGNOSIS — M19011 Primary osteoarthritis, right shoulder: Secondary | ICD-10-CM | POA: Diagnosis not present

## 2022-09-22 DIAGNOSIS — Z043 Encounter for examination and observation following other accident: Secondary | ICD-10-CM | POA: Diagnosis not present

## 2022-09-22 DIAGNOSIS — R55 Syncope and collapse: Secondary | ICD-10-CM | POA: Diagnosis not present

## 2022-09-22 DIAGNOSIS — H5440 Blindness, one eye, unspecified eye: Secondary | ICD-10-CM | POA: Diagnosis not present

## 2022-09-22 DIAGNOSIS — I6782 Cerebral ischemia: Secondary | ICD-10-CM | POA: Diagnosis not present

## 2022-09-22 DIAGNOSIS — I700 Atherosclerosis of aorta: Secondary | ICD-10-CM | POA: Diagnosis not present

## 2022-09-22 DIAGNOSIS — R7401 Elevation of levels of liver transaminase levels: Secondary | ICD-10-CM | POA: Diagnosis not present

## 2022-09-22 DIAGNOSIS — J984 Other disorders of lung: Secondary | ICD-10-CM | POA: Diagnosis not present

## 2022-09-22 DIAGNOSIS — J810 Acute pulmonary edema: Secondary | ICD-10-CM | POA: Diagnosis not present

## 2022-09-22 DIAGNOSIS — I509 Heart failure, unspecified: Secondary | ICD-10-CM | POA: Diagnosis not present

## 2022-09-22 DIAGNOSIS — I10 Essential (primary) hypertension: Secondary | ICD-10-CM | POA: Diagnosis not present

## 2022-09-22 DIAGNOSIS — E43 Unspecified severe protein-calorie malnutrition: Secondary | ICD-10-CM | POA: Diagnosis not present

## 2022-09-22 DIAGNOSIS — E876 Hypokalemia: Secondary | ICD-10-CM | POA: Diagnosis not present

## 2022-09-22 DIAGNOSIS — E7800 Pure hypercholesterolemia, unspecified: Secondary | ICD-10-CM | POA: Diagnosis not present

## 2022-09-22 DIAGNOSIS — I4891 Unspecified atrial fibrillation: Secondary | ICD-10-CM | POA: Diagnosis not present

## 2022-09-22 DIAGNOSIS — E871 Hypo-osmolality and hyponatremia: Secondary | ICD-10-CM | POA: Diagnosis not present

## 2022-09-22 DIAGNOSIS — I110 Hypertensive heart disease with heart failure: Secondary | ICD-10-CM | POA: Diagnosis not present

## 2022-09-22 DIAGNOSIS — D509 Iron deficiency anemia, unspecified: Secondary | ICD-10-CM | POA: Diagnosis not present

## 2022-09-22 DIAGNOSIS — F32A Depression, unspecified: Secondary | ICD-10-CM | POA: Diagnosis not present

## 2022-09-22 DIAGNOSIS — B9620 Unspecified Escherichia coli [E. coli] as the cause of diseases classified elsewhere: Secondary | ICD-10-CM | POA: Diagnosis not present

## 2022-09-22 DIAGNOSIS — M1990 Unspecified osteoarthritis, unspecified site: Secondary | ICD-10-CM | POA: Diagnosis not present

## 2022-09-22 DIAGNOSIS — K219 Gastro-esophageal reflux disease without esophagitis: Secondary | ICD-10-CM | POA: Diagnosis not present

## 2022-09-22 DIAGNOSIS — R296 Repeated falls: Secondary | ICD-10-CM | POA: Diagnosis not present

## 2022-09-22 DIAGNOSIS — S7000XA Contusion of unspecified hip, initial encounter: Secondary | ICD-10-CM | POA: Diagnosis not present

## 2022-09-23 DIAGNOSIS — G9341 Metabolic encephalopathy: Secondary | ICD-10-CM | POA: Diagnosis not present

## 2022-09-23 DIAGNOSIS — N390 Urinary tract infection, site not specified: Secondary | ICD-10-CM | POA: Diagnosis not present

## 2022-09-23 DIAGNOSIS — E43 Unspecified severe protein-calorie malnutrition: Secondary | ICD-10-CM | POA: Diagnosis not present

## 2022-09-24 DIAGNOSIS — N390 Urinary tract infection, site not specified: Secondary | ICD-10-CM | POA: Diagnosis not present

## 2022-09-24 DIAGNOSIS — G9341 Metabolic encephalopathy: Secondary | ICD-10-CM | POA: Diagnosis not present

## 2022-09-24 DIAGNOSIS — E43 Unspecified severe protein-calorie malnutrition: Secondary | ICD-10-CM | POA: Diagnosis not present

## 2022-09-25 DIAGNOSIS — N390 Urinary tract infection, site not specified: Secondary | ICD-10-CM | POA: Diagnosis not present

## 2022-09-25 DIAGNOSIS — G9341 Metabolic encephalopathy: Secondary | ICD-10-CM | POA: Diagnosis not present

## 2022-09-25 DIAGNOSIS — E43 Unspecified severe protein-calorie malnutrition: Secondary | ICD-10-CM | POA: Diagnosis not present

## 2022-09-26 DIAGNOSIS — I509 Heart failure, unspecified: Secondary | ICD-10-CM | POA: Diagnosis not present

## 2022-09-26 DIAGNOSIS — I4891 Unspecified atrial fibrillation: Secondary | ICD-10-CM | POA: Diagnosis not present

## 2022-09-26 DIAGNOSIS — D649 Anemia, unspecified: Secondary | ICD-10-CM | POA: Diagnosis not present

## 2022-09-26 DIAGNOSIS — Z7401 Bed confinement status: Secondary | ICD-10-CM | POA: Diagnosis not present

## 2022-09-26 DIAGNOSIS — I700 Atherosclerosis of aorta: Secondary | ICD-10-CM | POA: Diagnosis not present

## 2022-09-26 DIAGNOSIS — Z743 Need for continuous supervision: Secondary | ICD-10-CM | POA: Diagnosis not present

## 2022-09-26 DIAGNOSIS — R739 Hyperglycemia, unspecified: Secondary | ICD-10-CM | POA: Diagnosis not present

## 2022-09-26 DIAGNOSIS — R410 Disorientation, unspecified: Secondary | ICD-10-CM | POA: Diagnosis not present

## 2022-09-26 DIAGNOSIS — R140 Abdominal distension (gaseous): Secondary | ICD-10-CM | POA: Diagnosis not present

## 2022-09-26 DIAGNOSIS — K6389 Other specified diseases of intestine: Secondary | ICD-10-CM | POA: Diagnosis not present

## 2022-09-26 DIAGNOSIS — M419 Scoliosis, unspecified: Secondary | ICD-10-CM | POA: Diagnosis not present

## 2022-09-26 DIAGNOSIS — R4182 Altered mental status, unspecified: Secondary | ICD-10-CM | POA: Diagnosis not present

## 2022-09-26 DIAGNOSIS — I10 Essential (primary) hypertension: Secondary | ICD-10-CM | POA: Diagnosis not present

## 2022-09-26 DIAGNOSIS — D509 Iron deficiency anemia, unspecified: Secondary | ICD-10-CM | POA: Diagnosis not present

## 2022-09-26 DIAGNOSIS — G9341 Metabolic encephalopathy: Secondary | ICD-10-CM | POA: Diagnosis not present

## 2022-09-26 DIAGNOSIS — Z7409 Other reduced mobility: Secondary | ICD-10-CM | POA: Diagnosis not present

## 2022-09-26 DIAGNOSIS — R488 Other symbolic dysfunctions: Secondary | ICD-10-CM | POA: Diagnosis not present

## 2022-09-26 DIAGNOSIS — E785 Hyperlipidemia, unspecified: Secondary | ICD-10-CM | POA: Diagnosis not present

## 2022-09-26 DIAGNOSIS — R2681 Unsteadiness on feet: Secondary | ICD-10-CM | POA: Diagnosis not present

## 2022-09-26 DIAGNOSIS — E43 Unspecified severe protein-calorie malnutrition: Secondary | ICD-10-CM | POA: Diagnosis not present

## 2022-09-26 DIAGNOSIS — N390 Urinary tract infection, site not specified: Secondary | ICD-10-CM | POA: Diagnosis not present

## 2022-09-26 DIAGNOSIS — E039 Hypothyroidism, unspecified: Secondary | ICD-10-CM | POA: Diagnosis not present

## 2022-09-26 DIAGNOSIS — R531 Weakness: Secondary | ICD-10-CM | POA: Diagnosis not present

## 2022-09-26 DIAGNOSIS — M6281 Muscle weakness (generalized): Secondary | ICD-10-CM | POA: Diagnosis not present

## 2022-09-26 DIAGNOSIS — R4189 Other symptoms and signs involving cognitive functions and awareness: Secondary | ICD-10-CM | POA: Diagnosis not present

## 2022-09-26 DIAGNOSIS — Z741 Need for assistance with personal care: Secondary | ICD-10-CM | POA: Diagnosis not present

## 2022-09-26 DIAGNOSIS — K219 Gastro-esophageal reflux disease without esophagitis: Secondary | ICD-10-CM | POA: Diagnosis not present

## 2022-09-26 DIAGNOSIS — E119 Type 2 diabetes mellitus without complications: Secondary | ICD-10-CM | POA: Diagnosis not present

## 2022-09-26 DIAGNOSIS — R69 Illness, unspecified: Secondary | ICD-10-CM | POA: Diagnosis not present

## 2022-09-29 DIAGNOSIS — D649 Anemia, unspecified: Secondary | ICD-10-CM | POA: Diagnosis not present

## 2022-09-29 DIAGNOSIS — E039 Hypothyroidism, unspecified: Secondary | ICD-10-CM | POA: Diagnosis not present

## 2022-09-29 DIAGNOSIS — N390 Urinary tract infection, site not specified: Secondary | ICD-10-CM | POA: Diagnosis not present

## 2022-09-29 DIAGNOSIS — R4189 Other symptoms and signs involving cognitive functions and awareness: Secondary | ICD-10-CM | POA: Diagnosis not present

## 2022-09-29 DIAGNOSIS — R69 Illness, unspecified: Secondary | ICD-10-CM | POA: Diagnosis not present

## 2022-09-29 DIAGNOSIS — E43 Unspecified severe protein-calorie malnutrition: Secondary | ICD-10-CM | POA: Diagnosis not present

## 2022-09-29 DIAGNOSIS — I4891 Unspecified atrial fibrillation: Secondary | ICD-10-CM | POA: Diagnosis not present

## 2022-09-29 DIAGNOSIS — R739 Hyperglycemia, unspecified: Secondary | ICD-10-CM | POA: Diagnosis not present

## 2022-10-02 DIAGNOSIS — I482 Chronic atrial fibrillation, unspecified: Secondary | ICD-10-CM | POA: Diagnosis not present

## 2022-10-02 DIAGNOSIS — R58 Hemorrhage, not elsewhere classified: Secondary | ICD-10-CM | POA: Diagnosis not present

## 2022-10-02 DIAGNOSIS — R5381 Other malaise: Secondary | ICD-10-CM | POA: Diagnosis not present

## 2022-10-02 DIAGNOSIS — Z9221 Personal history of antineoplastic chemotherapy: Secondary | ICD-10-CM | POA: Diagnosis not present

## 2022-10-02 DIAGNOSIS — I340 Nonrheumatic mitral (valve) insufficiency: Secondary | ICD-10-CM | POA: Diagnosis not present

## 2022-10-02 DIAGNOSIS — R2689 Other abnormalities of gait and mobility: Secondary | ICD-10-CM | POA: Diagnosis not present

## 2022-10-02 DIAGNOSIS — E43 Unspecified severe protein-calorie malnutrition: Secondary | ICD-10-CM | POA: Diagnosis not present

## 2022-10-02 DIAGNOSIS — I5033 Acute on chronic diastolic (congestive) heart failure: Secondary | ICD-10-CM | POA: Diagnosis not present

## 2022-10-02 DIAGNOSIS — F32A Depression, unspecified: Secondary | ICD-10-CM | POA: Diagnosis not present

## 2022-10-02 DIAGNOSIS — Z6820 Body mass index (BMI) 20.0-20.9, adult: Secondary | ICD-10-CM | POA: Diagnosis not present

## 2022-10-02 DIAGNOSIS — I4891 Unspecified atrial fibrillation: Secondary | ICD-10-CM | POA: Diagnosis not present

## 2022-10-02 DIAGNOSIS — Z950 Presence of cardiac pacemaker: Secondary | ICD-10-CM | POA: Diagnosis not present

## 2022-10-02 DIAGNOSIS — R7401 Elevation of levels of liver transaminase levels: Secondary | ICD-10-CM | POA: Diagnosis not present

## 2022-10-02 DIAGNOSIS — K219 Gastro-esophageal reflux disease without esophagitis: Secondary | ICD-10-CM | POA: Diagnosis not present

## 2022-10-02 DIAGNOSIS — E871 Hypo-osmolality and hyponatremia: Secondary | ICD-10-CM | POA: Diagnosis not present

## 2022-10-02 DIAGNOSIS — I10 Essential (primary) hypertension: Secondary | ICD-10-CM | POA: Diagnosis not present

## 2022-10-02 DIAGNOSIS — E039 Hypothyroidism, unspecified: Secondary | ICD-10-CM | POA: Diagnosis not present

## 2022-10-02 DIAGNOSIS — Z8719 Personal history of other diseases of the digestive system: Secondary | ICD-10-CM | POA: Diagnosis not present

## 2022-10-02 DIAGNOSIS — N139 Obstructive and reflux uropathy, unspecified: Secondary | ICD-10-CM | POA: Diagnosis not present

## 2022-10-02 DIAGNOSIS — R339 Retention of urine, unspecified: Secondary | ICD-10-CM | POA: Diagnosis not present

## 2022-10-02 DIAGNOSIS — Z882 Allergy status to sulfonamides status: Secondary | ICD-10-CM | POA: Diagnosis not present

## 2022-10-02 DIAGNOSIS — K3189 Other diseases of stomach and duodenum: Secondary | ICD-10-CM | POA: Diagnosis not present

## 2022-10-02 DIAGNOSIS — E119 Type 2 diabetes mellitus without complications: Secondary | ICD-10-CM | POA: Diagnosis not present

## 2022-10-02 DIAGNOSIS — E222 Syndrome of inappropriate secretion of antidiuretic hormone: Secondary | ICD-10-CM | POA: Diagnosis not present

## 2022-10-02 DIAGNOSIS — F419 Anxiety disorder, unspecified: Secondary | ICD-10-CM | POA: Diagnosis not present

## 2022-10-02 DIAGNOSIS — K625 Hemorrhage of anus and rectum: Secondary | ICD-10-CM | POA: Diagnosis not present

## 2022-10-02 DIAGNOSIS — N3020 Other chronic cystitis without hematuria: Secondary | ICD-10-CM | POA: Diagnosis not present

## 2022-10-02 DIAGNOSIS — K529 Noninfective gastroenteritis and colitis, unspecified: Secondary | ICD-10-CM | POA: Diagnosis not present

## 2022-10-02 DIAGNOSIS — Z853 Personal history of malignant neoplasm of breast: Secondary | ICD-10-CM | POA: Diagnosis not present

## 2022-10-02 DIAGNOSIS — I959 Hypotension, unspecified: Secondary | ICD-10-CM | POA: Diagnosis not present

## 2022-10-02 DIAGNOSIS — E7800 Pure hypercholesterolemia, unspecified: Secondary | ICD-10-CM | POA: Diagnosis not present

## 2022-10-02 DIAGNOSIS — R945 Abnormal results of liver function studies: Secondary | ICD-10-CM | POA: Diagnosis not present

## 2022-10-02 DIAGNOSIS — I110 Hypertensive heart disease with heart failure: Secondary | ICD-10-CM | POA: Diagnosis not present

## 2022-10-02 DIAGNOSIS — K623 Rectal prolapse: Secondary | ICD-10-CM | POA: Diagnosis not present

## 2022-10-03 DIAGNOSIS — I340 Nonrheumatic mitral (valve) insufficiency: Secondary | ICD-10-CM | POA: Diagnosis not present

## 2022-10-03 DIAGNOSIS — I4891 Unspecified atrial fibrillation: Secondary | ICD-10-CM | POA: Diagnosis not present

## 2022-10-10 DIAGNOSIS — K219 Gastro-esophageal reflux disease without esophagitis: Secondary | ICD-10-CM | POA: Diagnosis not present

## 2022-10-10 DIAGNOSIS — F419 Anxiety disorder, unspecified: Secondary | ICD-10-CM | POA: Diagnosis not present

## 2022-10-10 DIAGNOSIS — J189 Pneumonia, unspecified organism: Secondary | ICD-10-CM | POA: Diagnosis not present

## 2022-10-10 DIAGNOSIS — Z743 Need for continuous supervision: Secondary | ICD-10-CM | POA: Diagnosis not present

## 2022-10-10 DIAGNOSIS — R2681 Unsteadiness on feet: Secondary | ICD-10-CM | POA: Diagnosis not present

## 2022-10-10 DIAGNOSIS — R0602 Shortness of breath: Secondary | ICD-10-CM | POA: Diagnosis not present

## 2022-10-10 DIAGNOSIS — E785 Hyperlipidemia, unspecified: Secondary | ICD-10-CM | POA: Diagnosis not present

## 2022-10-10 DIAGNOSIS — S0990XA Unspecified injury of head, initial encounter: Secondary | ICD-10-CM | POA: Diagnosis not present

## 2022-10-10 DIAGNOSIS — I509 Heart failure, unspecified: Secondary | ICD-10-CM | POA: Diagnosis not present

## 2022-10-10 DIAGNOSIS — N319 Neuromuscular dysfunction of bladder, unspecified: Secondary | ICD-10-CM | POA: Diagnosis not present

## 2022-10-10 DIAGNOSIS — N390 Urinary tract infection, site not specified: Secondary | ICD-10-CM | POA: Diagnosis not present

## 2022-10-10 DIAGNOSIS — R300 Dysuria: Secondary | ICD-10-CM | POA: Diagnosis not present

## 2022-10-10 DIAGNOSIS — Z5181 Encounter for therapeutic drug level monitoring: Secondary | ICD-10-CM | POA: Diagnosis not present

## 2022-10-10 DIAGNOSIS — J1100 Influenza due to unidentified influenza virus with unspecified type of pneumonia: Secondary | ICD-10-CM | POA: Diagnosis not present

## 2022-10-10 DIAGNOSIS — I700 Atherosclerosis of aorta: Secondary | ICD-10-CM | POA: Diagnosis not present

## 2022-10-10 DIAGNOSIS — M6281 Muscle weakness (generalized): Secondary | ICD-10-CM | POA: Diagnosis not present

## 2022-10-10 DIAGNOSIS — K5792 Diverticulitis of intestine, part unspecified, without perforation or abscess without bleeding: Secondary | ICD-10-CM | POA: Diagnosis not present

## 2022-10-10 DIAGNOSIS — Z741 Need for assistance with personal care: Secondary | ICD-10-CM | POA: Diagnosis not present

## 2022-10-10 DIAGNOSIS — E039 Hypothyroidism, unspecified: Secondary | ICD-10-CM | POA: Diagnosis not present

## 2022-10-10 DIAGNOSIS — S199XXA Unspecified injury of neck, initial encounter: Secondary | ICD-10-CM | POA: Diagnosis not present

## 2022-10-10 DIAGNOSIS — R339 Retention of urine, unspecified: Secondary | ICD-10-CM | POA: Diagnosis not present

## 2022-10-10 DIAGNOSIS — R110 Nausea: Secondary | ICD-10-CM | POA: Diagnosis not present

## 2022-10-10 DIAGNOSIS — E119 Type 2 diabetes mellitus without complications: Secondary | ICD-10-CM | POA: Diagnosis not present

## 2022-10-10 DIAGNOSIS — J810 Acute pulmonary edema: Secondary | ICD-10-CM | POA: Diagnosis not present

## 2022-10-10 DIAGNOSIS — I4510 Unspecified right bundle-branch block: Secondary | ICD-10-CM | POA: Diagnosis not present

## 2022-10-10 DIAGNOSIS — K529 Noninfective gastroenteritis and colitis, unspecified: Secondary | ICD-10-CM | POA: Diagnosis not present

## 2022-10-10 DIAGNOSIS — I4891 Unspecified atrial fibrillation: Secondary | ICD-10-CM | POA: Diagnosis not present

## 2022-10-10 DIAGNOSIS — F39 Unspecified mood [affective] disorder: Secondary | ICD-10-CM | POA: Diagnosis not present

## 2022-10-10 DIAGNOSIS — F32A Depression, unspecified: Secondary | ICD-10-CM | POA: Diagnosis not present

## 2022-10-10 DIAGNOSIS — K623 Rectal prolapse: Secondary | ICD-10-CM | POA: Diagnosis not present

## 2022-10-10 DIAGNOSIS — R102 Pelvic and perineal pain: Secondary | ICD-10-CM | POA: Diagnosis not present

## 2022-10-10 DIAGNOSIS — I444 Left anterior fascicular block: Secondary | ICD-10-CM | POA: Diagnosis not present

## 2022-10-10 DIAGNOSIS — M5481 Occipital neuralgia: Secondary | ICD-10-CM | POA: Diagnosis not present

## 2022-10-10 DIAGNOSIS — E871 Hypo-osmolality and hyponatremia: Secondary | ICD-10-CM | POA: Diagnosis not present

## 2022-10-10 DIAGNOSIS — J811 Chronic pulmonary edema: Secondary | ICD-10-CM | POA: Diagnosis not present

## 2022-10-10 DIAGNOSIS — J984 Other disorders of lung: Secondary | ICD-10-CM | POA: Diagnosis not present

## 2022-10-10 DIAGNOSIS — Z043 Encounter for examination and observation following other accident: Secondary | ICD-10-CM | POA: Diagnosis not present

## 2022-10-10 DIAGNOSIS — G9341 Metabolic encephalopathy: Secondary | ICD-10-CM | POA: Diagnosis not present

## 2022-10-10 DIAGNOSIS — R519 Headache, unspecified: Secondary | ICD-10-CM | POA: Diagnosis not present

## 2022-10-10 DIAGNOSIS — N320 Bladder-neck obstruction: Secondary | ICD-10-CM | POA: Diagnosis not present

## 2022-10-10 DIAGNOSIS — S299XXA Unspecified injury of thorax, initial encounter: Secondary | ICD-10-CM | POA: Diagnosis not present

## 2022-10-10 DIAGNOSIS — B3731 Acute candidiasis of vulva and vagina: Secondary | ICD-10-CM | POA: Diagnosis not present

## 2022-10-10 DIAGNOSIS — R410 Disorientation, unspecified: Secondary | ICD-10-CM | POA: Diagnosis not present

## 2022-10-10 DIAGNOSIS — E43 Unspecified severe protein-calorie malnutrition: Secondary | ICD-10-CM | POA: Diagnosis not present

## 2022-10-10 DIAGNOSIS — R488 Other symbolic dysfunctions: Secondary | ICD-10-CM | POA: Diagnosis not present

## 2022-10-10 DIAGNOSIS — Z7409 Other reduced mobility: Secondary | ICD-10-CM | POA: Diagnosis not present

## 2022-10-10 DIAGNOSIS — R8290 Unspecified abnormal findings in urine: Secondary | ICD-10-CM | POA: Diagnosis not present

## 2022-10-10 DIAGNOSIS — G319 Degenerative disease of nervous system, unspecified: Secondary | ICD-10-CM | POA: Diagnosis not present

## 2022-10-10 DIAGNOSIS — I2510 Atherosclerotic heart disease of native coronary artery without angina pectoris: Secondary | ICD-10-CM | POA: Diagnosis not present

## 2022-10-10 DIAGNOSIS — R531 Weakness: Secondary | ICD-10-CM | POA: Diagnosis not present

## 2022-10-10 DIAGNOSIS — K649 Unspecified hemorrhoids: Secondary | ICD-10-CM | POA: Diagnosis not present

## 2022-10-10 DIAGNOSIS — Z7901 Long term (current) use of anticoagulants: Secondary | ICD-10-CM | POA: Diagnosis not present

## 2022-10-10 DIAGNOSIS — R059 Cough, unspecified: Secondary | ICD-10-CM | POA: Diagnosis not present

## 2022-10-10 DIAGNOSIS — I959 Hypotension, unspecified: Secondary | ICD-10-CM | POA: Diagnosis not present

## 2022-10-10 DIAGNOSIS — Z978 Presence of other specified devices: Secondary | ICD-10-CM | POA: Diagnosis not present

## 2022-10-10 DIAGNOSIS — I10 Essential (primary) hypertension: Secondary | ICD-10-CM | POA: Diagnosis not present

## 2022-10-10 DIAGNOSIS — N952 Postmenopausal atrophic vaginitis: Secondary | ICD-10-CM | POA: Diagnosis not present

## 2022-10-10 DIAGNOSIS — R319 Hematuria, unspecified: Secondary | ICD-10-CM | POA: Diagnosis not present

## 2022-10-10 DIAGNOSIS — R52 Pain, unspecified: Secondary | ICD-10-CM | POA: Diagnosis not present

## 2022-10-10 DIAGNOSIS — I5033 Acute on chronic diastolic (congestive) heart failure: Secondary | ICD-10-CM | POA: Diagnosis not present

## 2022-10-10 DIAGNOSIS — R2231 Localized swelling, mass and lump, right upper limb: Secondary | ICD-10-CM | POA: Diagnosis not present

## 2022-10-10 DIAGNOSIS — G8929 Other chronic pain: Secondary | ICD-10-CM | POA: Diagnosis not present

## 2022-10-10 DIAGNOSIS — Z79899 Other long term (current) drug therapy: Secondary | ICD-10-CM | POA: Diagnosis not present

## 2022-10-10 DIAGNOSIS — M4319 Spondylolisthesis, multiple sites in spine: Secondary | ICD-10-CM | POA: Diagnosis not present

## 2022-10-10 DIAGNOSIS — K589 Irritable bowel syndrome without diarrhea: Secondary | ICD-10-CM | POA: Diagnosis not present

## 2022-10-10 DIAGNOSIS — Z7401 Bed confinement status: Secondary | ICD-10-CM | POA: Diagnosis not present

## 2022-10-10 DIAGNOSIS — N181 Chronic kidney disease, stage 1: Secondary | ICD-10-CM | POA: Diagnosis not present

## 2022-10-10 DIAGNOSIS — W19XXXA Unspecified fall, initial encounter: Secondary | ICD-10-CM | POA: Diagnosis not present

## 2022-10-10 DIAGNOSIS — R69 Illness, unspecified: Secondary | ICD-10-CM | POA: Diagnosis not present

## 2022-10-10 DIAGNOSIS — D509 Iron deficiency anemia, unspecified: Secondary | ICD-10-CM | POA: Diagnosis not present

## 2022-10-13 DIAGNOSIS — K649 Unspecified hemorrhoids: Secondary | ICD-10-CM | POA: Diagnosis not present

## 2022-10-13 DIAGNOSIS — N319 Neuromuscular dysfunction of bladder, unspecified: Secondary | ICD-10-CM | POA: Diagnosis not present

## 2022-10-13 DIAGNOSIS — N390 Urinary tract infection, site not specified: Secondary | ICD-10-CM | POA: Diagnosis not present

## 2022-10-13 DIAGNOSIS — R102 Pelvic and perineal pain: Secondary | ICD-10-CM | POA: Diagnosis not present

## 2022-10-20 DIAGNOSIS — R52 Pain, unspecified: Secondary | ICD-10-CM | POA: Diagnosis not present

## 2022-10-20 DIAGNOSIS — J189 Pneumonia, unspecified organism: Secondary | ICD-10-CM | POA: Diagnosis not present

## 2022-10-20 DIAGNOSIS — R059 Cough, unspecified: Secondary | ICD-10-CM | POA: Diagnosis not present

## 2022-10-21 DIAGNOSIS — J189 Pneumonia, unspecified organism: Secondary | ICD-10-CM | POA: Diagnosis not present

## 2022-10-29 DIAGNOSIS — J810 Acute pulmonary edema: Secondary | ICD-10-CM | POA: Diagnosis not present

## 2022-10-29 DIAGNOSIS — J189 Pneumonia, unspecified organism: Secondary | ICD-10-CM | POA: Diagnosis not present

## 2022-10-29 DIAGNOSIS — I509 Heart failure, unspecified: Secondary | ICD-10-CM | POA: Diagnosis not present

## 2022-10-29 DIAGNOSIS — I4891 Unspecified atrial fibrillation: Secondary | ICD-10-CM | POA: Diagnosis not present

## 2022-10-30 DIAGNOSIS — Z978 Presence of other specified devices: Secondary | ICD-10-CM | POA: Diagnosis not present

## 2022-10-30 DIAGNOSIS — R339 Retention of urine, unspecified: Secondary | ICD-10-CM | POA: Diagnosis not present

## 2022-10-30 DIAGNOSIS — B3731 Acute candidiasis of vulva and vagina: Secondary | ICD-10-CM | POA: Diagnosis not present

## 2022-10-30 DIAGNOSIS — Z79899 Other long term (current) drug therapy: Secondary | ICD-10-CM | POA: Diagnosis not present

## 2022-10-30 DIAGNOSIS — N952 Postmenopausal atrophic vaginitis: Secondary | ICD-10-CM | POA: Diagnosis not present

## 2022-11-05 DIAGNOSIS — J189 Pneumonia, unspecified organism: Secondary | ICD-10-CM | POA: Diagnosis not present

## 2022-11-05 DIAGNOSIS — I509 Heart failure, unspecified: Secondary | ICD-10-CM | POA: Diagnosis not present

## 2022-11-05 DIAGNOSIS — R0602 Shortness of breath: Secondary | ICD-10-CM | POA: Diagnosis not present

## 2022-11-05 DIAGNOSIS — Z5181 Encounter for therapeutic drug level monitoring: Secondary | ICD-10-CM | POA: Diagnosis not present

## 2022-11-10 DIAGNOSIS — I4891 Unspecified atrial fibrillation: Secondary | ICD-10-CM | POA: Diagnosis not present

## 2022-11-10 DIAGNOSIS — R488 Other symbolic dysfunctions: Secondary | ICD-10-CM | POA: Diagnosis not present

## 2022-11-10 DIAGNOSIS — Z741 Need for assistance with personal care: Secondary | ICD-10-CM | POA: Diagnosis not present

## 2022-11-10 DIAGNOSIS — R2681 Unsteadiness on feet: Secondary | ICD-10-CM | POA: Diagnosis not present

## 2022-11-10 DIAGNOSIS — M6281 Muscle weakness (generalized): Secondary | ICD-10-CM | POA: Diagnosis not present

## 2022-11-10 DIAGNOSIS — Z7409 Other reduced mobility: Secondary | ICD-10-CM | POA: Diagnosis not present

## 2022-11-11 DIAGNOSIS — I4891 Unspecified atrial fibrillation: Secondary | ICD-10-CM | POA: Diagnosis not present

## 2022-11-11 DIAGNOSIS — R2681 Unsteadiness on feet: Secondary | ICD-10-CM | POA: Diagnosis not present

## 2022-11-11 DIAGNOSIS — J984 Other disorders of lung: Secondary | ICD-10-CM | POA: Diagnosis not present

## 2022-11-11 DIAGNOSIS — Z741 Need for assistance with personal care: Secondary | ICD-10-CM | POA: Diagnosis not present

## 2022-11-11 DIAGNOSIS — R300 Dysuria: Secondary | ICD-10-CM | POA: Diagnosis not present

## 2022-11-11 DIAGNOSIS — Z7409 Other reduced mobility: Secondary | ICD-10-CM | POA: Diagnosis not present

## 2022-11-11 DIAGNOSIS — I10 Essential (primary) hypertension: Secondary | ICD-10-CM | POA: Diagnosis not present

## 2022-11-11 DIAGNOSIS — M6281 Muscle weakness (generalized): Secondary | ICD-10-CM | POA: Diagnosis not present

## 2022-11-11 DIAGNOSIS — R488 Other symbolic dysfunctions: Secondary | ICD-10-CM | POA: Diagnosis not present

## 2022-11-12 DIAGNOSIS — R2681 Unsteadiness on feet: Secondary | ICD-10-CM | POA: Diagnosis not present

## 2022-11-12 DIAGNOSIS — J189 Pneumonia, unspecified organism: Secondary | ICD-10-CM | POA: Diagnosis not present

## 2022-11-12 DIAGNOSIS — I4891 Unspecified atrial fibrillation: Secondary | ICD-10-CM | POA: Diagnosis not present

## 2022-11-12 DIAGNOSIS — Z741 Need for assistance with personal care: Secondary | ICD-10-CM | POA: Diagnosis not present

## 2022-11-12 DIAGNOSIS — R300 Dysuria: Secondary | ICD-10-CM | POA: Diagnosis not present

## 2022-11-12 DIAGNOSIS — M6281 Muscle weakness (generalized): Secondary | ICD-10-CM | POA: Diagnosis not present

## 2022-11-12 DIAGNOSIS — N390 Urinary tract infection, site not specified: Secondary | ICD-10-CM | POA: Diagnosis not present

## 2022-11-12 DIAGNOSIS — Z7409 Other reduced mobility: Secondary | ICD-10-CM | POA: Diagnosis not present

## 2022-11-12 DIAGNOSIS — N181 Chronic kidney disease, stage 1: Secondary | ICD-10-CM | POA: Diagnosis not present

## 2022-11-12 DIAGNOSIS — E871 Hypo-osmolality and hyponatremia: Secondary | ICD-10-CM | POA: Diagnosis not present

## 2022-11-12 DIAGNOSIS — R488 Other symbolic dysfunctions: Secondary | ICD-10-CM | POA: Diagnosis not present

## 2022-11-13 DIAGNOSIS — R2681 Unsteadiness on feet: Secondary | ICD-10-CM | POA: Diagnosis not present

## 2022-11-13 DIAGNOSIS — M6281 Muscle weakness (generalized): Secondary | ICD-10-CM | POA: Diagnosis not present

## 2022-11-13 DIAGNOSIS — R300 Dysuria: Secondary | ICD-10-CM | POA: Diagnosis not present

## 2022-11-13 DIAGNOSIS — Z7409 Other reduced mobility: Secondary | ICD-10-CM | POA: Diagnosis not present

## 2022-11-13 DIAGNOSIS — Z741 Need for assistance with personal care: Secondary | ICD-10-CM | POA: Diagnosis not present

## 2022-11-13 DIAGNOSIS — R488 Other symbolic dysfunctions: Secondary | ICD-10-CM | POA: Diagnosis not present

## 2022-11-13 DIAGNOSIS — J189 Pneumonia, unspecified organism: Secondary | ICD-10-CM | POA: Diagnosis not present

## 2022-11-13 DIAGNOSIS — I4891 Unspecified atrial fibrillation: Secondary | ICD-10-CM | POA: Diagnosis not present

## 2022-11-14 DIAGNOSIS — M6281 Muscle weakness (generalized): Secondary | ICD-10-CM | POA: Diagnosis not present

## 2022-11-14 DIAGNOSIS — R2681 Unsteadiness on feet: Secondary | ICD-10-CM | POA: Diagnosis not present

## 2022-11-14 DIAGNOSIS — R488 Other symbolic dysfunctions: Secondary | ICD-10-CM | POA: Diagnosis not present

## 2022-11-14 DIAGNOSIS — Z7409 Other reduced mobility: Secondary | ICD-10-CM | POA: Diagnosis not present

## 2022-11-14 DIAGNOSIS — Z741 Need for assistance with personal care: Secondary | ICD-10-CM | POA: Diagnosis not present

## 2022-11-14 DIAGNOSIS — I4891 Unspecified atrial fibrillation: Secondary | ICD-10-CM | POA: Diagnosis not present

## 2022-11-17 DIAGNOSIS — I4891 Unspecified atrial fibrillation: Secondary | ICD-10-CM | POA: Diagnosis not present

## 2022-11-17 DIAGNOSIS — J1100 Influenza due to unidentified influenza virus with unspecified type of pneumonia: Secondary | ICD-10-CM | POA: Diagnosis not present

## 2022-11-17 DIAGNOSIS — R488 Other symbolic dysfunctions: Secondary | ICD-10-CM | POA: Diagnosis not present

## 2022-11-17 DIAGNOSIS — R2681 Unsteadiness on feet: Secondary | ICD-10-CM | POA: Diagnosis not present

## 2022-11-17 DIAGNOSIS — M6281 Muscle weakness (generalized): Secondary | ICD-10-CM | POA: Diagnosis not present

## 2022-11-17 DIAGNOSIS — I10 Essential (primary) hypertension: Secondary | ICD-10-CM | POA: Diagnosis not present

## 2022-11-17 DIAGNOSIS — Z741 Need for assistance with personal care: Secondary | ICD-10-CM | POA: Diagnosis not present

## 2022-11-17 DIAGNOSIS — Z7409 Other reduced mobility: Secondary | ICD-10-CM | POA: Diagnosis not present

## 2022-11-18 DIAGNOSIS — Z741 Need for assistance with personal care: Secondary | ICD-10-CM | POA: Diagnosis not present

## 2022-11-18 DIAGNOSIS — N390 Urinary tract infection, site not specified: Secondary | ICD-10-CM | POA: Diagnosis not present

## 2022-11-18 DIAGNOSIS — M6281 Muscle weakness (generalized): Secondary | ICD-10-CM | POA: Diagnosis not present

## 2022-11-18 DIAGNOSIS — R319 Hematuria, unspecified: Secondary | ICD-10-CM | POA: Diagnosis not present

## 2022-11-18 DIAGNOSIS — R488 Other symbolic dysfunctions: Secondary | ICD-10-CM | POA: Diagnosis not present

## 2022-11-18 DIAGNOSIS — I4891 Unspecified atrial fibrillation: Secondary | ICD-10-CM | POA: Diagnosis not present

## 2022-11-18 DIAGNOSIS — Z7409 Other reduced mobility: Secondary | ICD-10-CM | POA: Diagnosis not present

## 2022-11-18 DIAGNOSIS — J189 Pneumonia, unspecified organism: Secondary | ICD-10-CM | POA: Diagnosis not present

## 2022-11-18 DIAGNOSIS — R110 Nausea: Secondary | ICD-10-CM | POA: Diagnosis not present

## 2022-11-18 DIAGNOSIS — R2681 Unsteadiness on feet: Secondary | ICD-10-CM | POA: Diagnosis not present

## 2022-11-19 DIAGNOSIS — N390 Urinary tract infection, site not specified: Secondary | ICD-10-CM | POA: Diagnosis not present

## 2022-11-19 DIAGNOSIS — R488 Other symbolic dysfunctions: Secondary | ICD-10-CM | POA: Diagnosis not present

## 2022-11-19 DIAGNOSIS — I4891 Unspecified atrial fibrillation: Secondary | ICD-10-CM | POA: Diagnosis not present

## 2022-11-19 DIAGNOSIS — R69 Illness, unspecified: Secondary | ICD-10-CM | POA: Diagnosis not present

## 2022-11-19 DIAGNOSIS — Z7409 Other reduced mobility: Secondary | ICD-10-CM | POA: Diagnosis not present

## 2022-11-19 DIAGNOSIS — M6281 Muscle weakness (generalized): Secondary | ICD-10-CM | POA: Diagnosis not present

## 2022-11-19 DIAGNOSIS — R2681 Unsteadiness on feet: Secondary | ICD-10-CM | POA: Diagnosis not present

## 2022-11-19 DIAGNOSIS — R319 Hematuria, unspecified: Secondary | ICD-10-CM | POA: Diagnosis not present

## 2022-11-19 DIAGNOSIS — R451 Restlessness and agitation: Secondary | ICD-10-CM | POA: Diagnosis not present

## 2022-11-19 DIAGNOSIS — Z741 Need for assistance with personal care: Secondary | ICD-10-CM | POA: Diagnosis not present

## 2022-11-20 DIAGNOSIS — Z743 Need for continuous supervision: Secondary | ICD-10-CM | POA: Diagnosis not present

## 2022-11-20 DIAGNOSIS — Z741 Need for assistance with personal care: Secondary | ICD-10-CM | POA: Diagnosis not present

## 2022-11-20 DIAGNOSIS — R488 Other symbolic dysfunctions: Secondary | ICD-10-CM | POA: Diagnosis not present

## 2022-11-20 DIAGNOSIS — I509 Heart failure, unspecified: Secondary | ICD-10-CM | POA: Diagnosis not present

## 2022-11-20 DIAGNOSIS — I4891 Unspecified atrial fibrillation: Secondary | ICD-10-CM | POA: Diagnosis not present

## 2022-11-20 DIAGNOSIS — R2681 Unsteadiness on feet: Secondary | ICD-10-CM | POA: Diagnosis not present

## 2022-11-20 DIAGNOSIS — R531 Weakness: Secondary | ICD-10-CM | POA: Diagnosis not present

## 2022-11-20 DIAGNOSIS — K219 Gastro-esophageal reflux disease without esophagitis: Secondary | ICD-10-CM | POA: Diagnosis not present

## 2022-11-20 DIAGNOSIS — M6281 Muscle weakness (generalized): Secondary | ICD-10-CM | POA: Diagnosis not present

## 2022-11-20 DIAGNOSIS — K623 Rectal prolapse: Secondary | ICD-10-CM | POA: Diagnosis not present

## 2022-11-20 DIAGNOSIS — Z7409 Other reduced mobility: Secondary | ICD-10-CM | POA: Diagnosis not present

## 2022-11-21 DIAGNOSIS — M6281 Muscle weakness (generalized): Secondary | ICD-10-CM | POA: Diagnosis not present

## 2022-11-21 DIAGNOSIS — R488 Other symbolic dysfunctions: Secondary | ICD-10-CM | POA: Diagnosis not present

## 2022-11-21 DIAGNOSIS — Z741 Need for assistance with personal care: Secondary | ICD-10-CM | POA: Diagnosis not present

## 2022-11-21 DIAGNOSIS — R2681 Unsteadiness on feet: Secondary | ICD-10-CM | POA: Diagnosis not present

## 2022-11-21 DIAGNOSIS — I4891 Unspecified atrial fibrillation: Secondary | ICD-10-CM | POA: Diagnosis not present

## 2022-11-21 DIAGNOSIS — Z7409 Other reduced mobility: Secondary | ICD-10-CM | POA: Diagnosis not present

## 2022-11-24 DIAGNOSIS — M6281 Muscle weakness (generalized): Secondary | ICD-10-CM | POA: Diagnosis not present

## 2022-11-24 DIAGNOSIS — R488 Other symbolic dysfunctions: Secondary | ICD-10-CM | POA: Diagnosis not present

## 2022-11-24 DIAGNOSIS — Z7409 Other reduced mobility: Secondary | ICD-10-CM | POA: Diagnosis not present

## 2022-11-24 DIAGNOSIS — I4891 Unspecified atrial fibrillation: Secondary | ICD-10-CM | POA: Diagnosis not present

## 2022-11-24 DIAGNOSIS — Z741 Need for assistance with personal care: Secondary | ICD-10-CM | POA: Diagnosis not present

## 2022-11-24 DIAGNOSIS — R2681 Unsteadiness on feet: Secondary | ICD-10-CM | POA: Diagnosis not present

## 2022-11-25 DIAGNOSIS — R488 Other symbolic dysfunctions: Secondary | ICD-10-CM | POA: Diagnosis not present

## 2022-11-25 DIAGNOSIS — Z7409 Other reduced mobility: Secondary | ICD-10-CM | POA: Diagnosis not present

## 2022-11-25 DIAGNOSIS — Z741 Need for assistance with personal care: Secondary | ICD-10-CM | POA: Diagnosis not present

## 2022-11-25 DIAGNOSIS — I4891 Unspecified atrial fibrillation: Secondary | ICD-10-CM | POA: Diagnosis not present

## 2022-11-25 DIAGNOSIS — R2681 Unsteadiness on feet: Secondary | ICD-10-CM | POA: Diagnosis not present

## 2022-11-25 DIAGNOSIS — M6281 Muscle weakness (generalized): Secondary | ICD-10-CM | POA: Diagnosis not present

## 2022-11-26 DIAGNOSIS — Z7409 Other reduced mobility: Secondary | ICD-10-CM | POA: Diagnosis not present

## 2022-11-26 DIAGNOSIS — N390 Urinary tract infection, site not specified: Secondary | ICD-10-CM | POA: Diagnosis not present

## 2022-11-26 DIAGNOSIS — R451 Restlessness and agitation: Secondary | ICD-10-CM | POA: Diagnosis not present

## 2022-11-26 DIAGNOSIS — I4891 Unspecified atrial fibrillation: Secondary | ICD-10-CM | POA: Diagnosis not present

## 2022-11-26 DIAGNOSIS — R488 Other symbolic dysfunctions: Secondary | ICD-10-CM | POA: Diagnosis not present

## 2022-11-26 DIAGNOSIS — M6281 Muscle weakness (generalized): Secondary | ICD-10-CM | POA: Diagnosis not present

## 2022-11-26 DIAGNOSIS — R69 Illness, unspecified: Secondary | ICD-10-CM | POA: Diagnosis not present

## 2022-11-26 DIAGNOSIS — Z741 Need for assistance with personal care: Secondary | ICD-10-CM | POA: Diagnosis not present

## 2022-11-26 DIAGNOSIS — R2681 Unsteadiness on feet: Secondary | ICD-10-CM | POA: Diagnosis not present

## 2022-11-27 DIAGNOSIS — Z741 Need for assistance with personal care: Secondary | ICD-10-CM | POA: Diagnosis not present

## 2022-11-27 DIAGNOSIS — R488 Other symbolic dysfunctions: Secondary | ICD-10-CM | POA: Diagnosis not present

## 2022-11-27 DIAGNOSIS — Z7409 Other reduced mobility: Secondary | ICD-10-CM | POA: Diagnosis not present

## 2022-11-27 DIAGNOSIS — I4891 Unspecified atrial fibrillation: Secondary | ICD-10-CM | POA: Diagnosis not present

## 2022-11-27 DIAGNOSIS — R2681 Unsteadiness on feet: Secondary | ICD-10-CM | POA: Diagnosis not present

## 2022-11-27 DIAGNOSIS — M6281 Muscle weakness (generalized): Secondary | ICD-10-CM | POA: Diagnosis not present

## 2022-11-28 DIAGNOSIS — I4891 Unspecified atrial fibrillation: Secondary | ICD-10-CM | POA: Diagnosis not present

## 2022-11-28 DIAGNOSIS — R488 Other symbolic dysfunctions: Secondary | ICD-10-CM | POA: Diagnosis not present

## 2022-11-28 DIAGNOSIS — R2681 Unsteadiness on feet: Secondary | ICD-10-CM | POA: Diagnosis not present

## 2022-11-28 DIAGNOSIS — Z741 Need for assistance with personal care: Secondary | ICD-10-CM | POA: Diagnosis not present

## 2022-11-28 DIAGNOSIS — Z7409 Other reduced mobility: Secondary | ICD-10-CM | POA: Diagnosis not present

## 2022-11-28 DIAGNOSIS — M6281 Muscle weakness (generalized): Secondary | ICD-10-CM | POA: Diagnosis not present

## 2022-12-01 DIAGNOSIS — I4891 Unspecified atrial fibrillation: Secondary | ICD-10-CM | POA: Diagnosis not present

## 2022-12-01 DIAGNOSIS — R2681 Unsteadiness on feet: Secondary | ICD-10-CM | POA: Diagnosis not present

## 2022-12-01 DIAGNOSIS — Z741 Need for assistance with personal care: Secondary | ICD-10-CM | POA: Diagnosis not present

## 2022-12-01 DIAGNOSIS — R488 Other symbolic dysfunctions: Secondary | ICD-10-CM | POA: Diagnosis not present

## 2022-12-01 DIAGNOSIS — M6281 Muscle weakness (generalized): Secondary | ICD-10-CM | POA: Diagnosis not present

## 2022-12-01 DIAGNOSIS — Z7409 Other reduced mobility: Secondary | ICD-10-CM | POA: Diagnosis not present

## 2022-12-02 DIAGNOSIS — I4891 Unspecified atrial fibrillation: Secondary | ICD-10-CM | POA: Diagnosis not present

## 2022-12-02 DIAGNOSIS — Z7409 Other reduced mobility: Secondary | ICD-10-CM | POA: Diagnosis not present

## 2022-12-02 DIAGNOSIS — Z741 Need for assistance with personal care: Secondary | ICD-10-CM | POA: Diagnosis not present

## 2022-12-02 DIAGNOSIS — R488 Other symbolic dysfunctions: Secondary | ICD-10-CM | POA: Diagnosis not present

## 2022-12-02 DIAGNOSIS — R2681 Unsteadiness on feet: Secondary | ICD-10-CM | POA: Diagnosis not present

## 2022-12-02 DIAGNOSIS — M6281 Muscle weakness (generalized): Secondary | ICD-10-CM | POA: Diagnosis not present

## 2022-12-03 DIAGNOSIS — R488 Other symbolic dysfunctions: Secondary | ICD-10-CM | POA: Diagnosis not present

## 2022-12-03 DIAGNOSIS — I4891 Unspecified atrial fibrillation: Secondary | ICD-10-CM | POA: Diagnosis not present

## 2022-12-03 DIAGNOSIS — M6281 Muscle weakness (generalized): Secondary | ICD-10-CM | POA: Diagnosis not present

## 2022-12-03 DIAGNOSIS — Z7409 Other reduced mobility: Secondary | ICD-10-CM | POA: Diagnosis not present

## 2022-12-03 DIAGNOSIS — Z741 Need for assistance with personal care: Secondary | ICD-10-CM | POA: Diagnosis not present

## 2022-12-03 DIAGNOSIS — R2681 Unsteadiness on feet: Secondary | ICD-10-CM | POA: Diagnosis not present

## 2022-12-04 DIAGNOSIS — K623 Rectal prolapse: Secondary | ICD-10-CM | POA: Diagnosis not present

## 2022-12-04 DIAGNOSIS — R2681 Unsteadiness on feet: Secondary | ICD-10-CM | POA: Diagnosis not present

## 2022-12-04 DIAGNOSIS — R488 Other symbolic dysfunctions: Secondary | ICD-10-CM | POA: Diagnosis not present

## 2022-12-04 DIAGNOSIS — M6281 Muscle weakness (generalized): Secondary | ICD-10-CM | POA: Diagnosis not present

## 2022-12-04 DIAGNOSIS — Z7409 Other reduced mobility: Secondary | ICD-10-CM | POA: Diagnosis not present

## 2022-12-04 DIAGNOSIS — I4891 Unspecified atrial fibrillation: Secondary | ICD-10-CM | POA: Diagnosis not present

## 2022-12-04 DIAGNOSIS — Z741 Need for assistance with personal care: Secondary | ICD-10-CM | POA: Diagnosis not present

## 2022-12-05 DIAGNOSIS — M6281 Muscle weakness (generalized): Secondary | ICD-10-CM | POA: Diagnosis not present

## 2022-12-05 DIAGNOSIS — Z741 Need for assistance with personal care: Secondary | ICD-10-CM | POA: Diagnosis not present

## 2022-12-05 DIAGNOSIS — R488 Other symbolic dysfunctions: Secondary | ICD-10-CM | POA: Diagnosis not present

## 2022-12-05 DIAGNOSIS — I4891 Unspecified atrial fibrillation: Secondary | ICD-10-CM | POA: Diagnosis not present

## 2022-12-05 DIAGNOSIS — R2681 Unsteadiness on feet: Secondary | ICD-10-CM | POA: Diagnosis not present

## 2022-12-05 DIAGNOSIS — Z7409 Other reduced mobility: Secondary | ICD-10-CM | POA: Diagnosis not present

## 2022-12-08 DIAGNOSIS — Z741 Need for assistance with personal care: Secondary | ICD-10-CM | POA: Diagnosis not present

## 2022-12-08 DIAGNOSIS — R488 Other symbolic dysfunctions: Secondary | ICD-10-CM | POA: Diagnosis not present

## 2022-12-08 DIAGNOSIS — I4891 Unspecified atrial fibrillation: Secondary | ICD-10-CM | POA: Diagnosis not present

## 2022-12-08 DIAGNOSIS — R2681 Unsteadiness on feet: Secondary | ICD-10-CM | POA: Diagnosis not present

## 2022-12-08 DIAGNOSIS — M6281 Muscle weakness (generalized): Secondary | ICD-10-CM | POA: Diagnosis not present

## 2022-12-08 DIAGNOSIS — Z7409 Other reduced mobility: Secondary | ICD-10-CM | POA: Diagnosis not present

## 2022-12-09 DIAGNOSIS — Z7409 Other reduced mobility: Secondary | ICD-10-CM | POA: Diagnosis not present

## 2022-12-09 DIAGNOSIS — I4891 Unspecified atrial fibrillation: Secondary | ICD-10-CM | POA: Diagnosis not present

## 2022-12-09 DIAGNOSIS — M6281 Muscle weakness (generalized): Secondary | ICD-10-CM | POA: Diagnosis not present

## 2022-12-09 DIAGNOSIS — R2681 Unsteadiness on feet: Secondary | ICD-10-CM | POA: Diagnosis not present

## 2022-12-09 DIAGNOSIS — Z741 Need for assistance with personal care: Secondary | ICD-10-CM | POA: Diagnosis not present

## 2022-12-09 DIAGNOSIS — R488 Other symbolic dysfunctions: Secondary | ICD-10-CM | POA: Diagnosis not present

## 2022-12-10 DIAGNOSIS — R488 Other symbolic dysfunctions: Secondary | ICD-10-CM | POA: Diagnosis not present

## 2022-12-10 DIAGNOSIS — M6281 Muscle weakness (generalized): Secondary | ICD-10-CM | POA: Diagnosis not present

## 2022-12-10 DIAGNOSIS — I4891 Unspecified atrial fibrillation: Secondary | ICD-10-CM | POA: Diagnosis not present

## 2022-12-10 DIAGNOSIS — Z7409 Other reduced mobility: Secondary | ICD-10-CM | POA: Diagnosis not present

## 2022-12-10 DIAGNOSIS — Z741 Need for assistance with personal care: Secondary | ICD-10-CM | POA: Diagnosis not present

## 2022-12-10 DIAGNOSIS — R2681 Unsteadiness on feet: Secondary | ICD-10-CM | POA: Diagnosis not present

## 2022-12-11 DIAGNOSIS — I4891 Unspecified atrial fibrillation: Secondary | ICD-10-CM | POA: Diagnosis not present

## 2022-12-11 DIAGNOSIS — Z7409 Other reduced mobility: Secondary | ICD-10-CM | POA: Diagnosis not present

## 2022-12-11 DIAGNOSIS — R488 Other symbolic dysfunctions: Secondary | ICD-10-CM | POA: Diagnosis not present

## 2022-12-11 DIAGNOSIS — M6281 Muscle weakness (generalized): Secondary | ICD-10-CM | POA: Diagnosis not present

## 2022-12-11 DIAGNOSIS — Z741 Need for assistance with personal care: Secondary | ICD-10-CM | POA: Diagnosis not present

## 2022-12-11 DIAGNOSIS — R2681 Unsteadiness on feet: Secondary | ICD-10-CM | POA: Diagnosis not present

## 2022-12-12 DIAGNOSIS — R2681 Unsteadiness on feet: Secondary | ICD-10-CM | POA: Diagnosis not present

## 2022-12-12 DIAGNOSIS — Z7409 Other reduced mobility: Secondary | ICD-10-CM | POA: Diagnosis not present

## 2022-12-12 DIAGNOSIS — I4891 Unspecified atrial fibrillation: Secondary | ICD-10-CM | POA: Diagnosis not present

## 2022-12-12 DIAGNOSIS — M6281 Muscle weakness (generalized): Secondary | ICD-10-CM | POA: Diagnosis not present

## 2022-12-12 DIAGNOSIS — Z741 Need for assistance with personal care: Secondary | ICD-10-CM | POA: Diagnosis not present

## 2022-12-12 DIAGNOSIS — R488 Other symbolic dysfunctions: Secondary | ICD-10-CM | POA: Diagnosis not present

## 2022-12-14 DIAGNOSIS — N390 Urinary tract infection, site not specified: Secondary | ICD-10-CM | POA: Diagnosis not present

## 2022-12-15 DIAGNOSIS — I4891 Unspecified atrial fibrillation: Secondary | ICD-10-CM | POA: Diagnosis not present

## 2022-12-15 DIAGNOSIS — I10 Essential (primary) hypertension: Secondary | ICD-10-CM | POA: Diagnosis not present

## 2022-12-15 DIAGNOSIS — R488 Other symbolic dysfunctions: Secondary | ICD-10-CM | POA: Diagnosis not present

## 2022-12-15 DIAGNOSIS — Z043 Encounter for examination and observation following other accident: Secondary | ICD-10-CM | POA: Diagnosis not present

## 2022-12-15 DIAGNOSIS — Z741 Need for assistance with personal care: Secondary | ICD-10-CM | POA: Diagnosis not present

## 2022-12-15 DIAGNOSIS — M6281 Muscle weakness (generalized): Secondary | ICD-10-CM | POA: Diagnosis not present

## 2022-12-15 DIAGNOSIS — Z7409 Other reduced mobility: Secondary | ICD-10-CM | POA: Diagnosis not present

## 2022-12-15 DIAGNOSIS — R2681 Unsteadiness on feet: Secondary | ICD-10-CM | POA: Diagnosis not present

## 2022-12-16 DIAGNOSIS — R2681 Unsteadiness on feet: Secondary | ICD-10-CM | POA: Diagnosis not present

## 2022-12-16 DIAGNOSIS — R488 Other symbolic dysfunctions: Secondary | ICD-10-CM | POA: Diagnosis not present

## 2022-12-16 DIAGNOSIS — I4891 Unspecified atrial fibrillation: Secondary | ICD-10-CM | POA: Diagnosis not present

## 2022-12-16 DIAGNOSIS — R69 Illness, unspecified: Secondary | ICD-10-CM | POA: Diagnosis not present

## 2022-12-16 DIAGNOSIS — F03918 Unspecified dementia, unspecified severity, with other behavioral disturbance: Secondary | ICD-10-CM | POA: Diagnosis not present

## 2022-12-16 DIAGNOSIS — Z741 Need for assistance with personal care: Secondary | ICD-10-CM | POA: Diagnosis not present

## 2022-12-16 DIAGNOSIS — Z7409 Other reduced mobility: Secondary | ICD-10-CM | POA: Diagnosis not present

## 2022-12-16 DIAGNOSIS — M6281 Muscle weakness (generalized): Secondary | ICD-10-CM | POA: Diagnosis not present

## 2022-12-17 DIAGNOSIS — G8929 Other chronic pain: Secondary | ICD-10-CM | POA: Diagnosis not present

## 2022-12-17 DIAGNOSIS — R488 Other symbolic dysfunctions: Secondary | ICD-10-CM | POA: Diagnosis not present

## 2022-12-17 DIAGNOSIS — R52 Pain, unspecified: Secondary | ICD-10-CM | POA: Diagnosis not present

## 2022-12-17 DIAGNOSIS — Z5181 Encounter for therapeutic drug level monitoring: Secondary | ICD-10-CM | POA: Diagnosis not present

## 2022-12-17 DIAGNOSIS — M6281 Muscle weakness (generalized): Secondary | ICD-10-CM | POA: Diagnosis not present

## 2022-12-17 DIAGNOSIS — Z7409 Other reduced mobility: Secondary | ICD-10-CM | POA: Diagnosis not present

## 2022-12-17 DIAGNOSIS — N390 Urinary tract infection, site not specified: Secondary | ICD-10-CM | POA: Diagnosis not present

## 2022-12-17 DIAGNOSIS — R2681 Unsteadiness on feet: Secondary | ICD-10-CM | POA: Diagnosis not present

## 2022-12-17 DIAGNOSIS — I4891 Unspecified atrial fibrillation: Secondary | ICD-10-CM | POA: Diagnosis not present

## 2022-12-17 DIAGNOSIS — Z741 Need for assistance with personal care: Secondary | ICD-10-CM | POA: Diagnosis not present

## 2022-12-18 DIAGNOSIS — R2231 Localized swelling, mass and lump, right upper limb: Secondary | ICD-10-CM | POA: Diagnosis not present

## 2022-12-18 DIAGNOSIS — I4891 Unspecified atrial fibrillation: Secondary | ICD-10-CM | POA: Diagnosis not present

## 2022-12-18 DIAGNOSIS — R52 Pain, unspecified: Secondary | ICD-10-CM | POA: Diagnosis not present

## 2022-12-18 DIAGNOSIS — R488 Other symbolic dysfunctions: Secondary | ICD-10-CM | POA: Diagnosis not present

## 2022-12-18 DIAGNOSIS — R2681 Unsteadiness on feet: Secondary | ICD-10-CM | POA: Diagnosis not present

## 2022-12-18 DIAGNOSIS — Z741 Need for assistance with personal care: Secondary | ICD-10-CM | POA: Diagnosis not present

## 2022-12-18 DIAGNOSIS — R8290 Unspecified abnormal findings in urine: Secondary | ICD-10-CM | POA: Diagnosis not present

## 2022-12-18 DIAGNOSIS — Z7409 Other reduced mobility: Secondary | ICD-10-CM | POA: Diagnosis not present

## 2022-12-18 DIAGNOSIS — G8929 Other chronic pain: Secondary | ICD-10-CM | POA: Diagnosis not present

## 2022-12-18 DIAGNOSIS — M6281 Muscle weakness (generalized): Secondary | ICD-10-CM | POA: Diagnosis not present

## 2022-12-19 DIAGNOSIS — R2681 Unsteadiness on feet: Secondary | ICD-10-CM | POA: Diagnosis not present

## 2022-12-19 DIAGNOSIS — Z741 Need for assistance with personal care: Secondary | ICD-10-CM | POA: Diagnosis not present

## 2022-12-19 DIAGNOSIS — M6281 Muscle weakness (generalized): Secondary | ICD-10-CM | POA: Diagnosis not present

## 2022-12-19 DIAGNOSIS — R488 Other symbolic dysfunctions: Secondary | ICD-10-CM | POA: Diagnosis not present

## 2022-12-19 DIAGNOSIS — Z7409 Other reduced mobility: Secondary | ICD-10-CM | POA: Diagnosis not present

## 2022-12-19 DIAGNOSIS — I4891 Unspecified atrial fibrillation: Secondary | ICD-10-CM | POA: Diagnosis not present

## 2022-12-22 DIAGNOSIS — R519 Headache, unspecified: Secondary | ICD-10-CM | POA: Diagnosis not present

## 2022-12-22 DIAGNOSIS — S299XXA Unspecified injury of thorax, initial encounter: Secondary | ICD-10-CM | POA: Diagnosis not present

## 2022-12-22 DIAGNOSIS — Z7409 Other reduced mobility: Secondary | ICD-10-CM | POA: Diagnosis not present

## 2022-12-22 DIAGNOSIS — I4891 Unspecified atrial fibrillation: Secondary | ICD-10-CM | POA: Diagnosis not present

## 2022-12-22 DIAGNOSIS — S199XXA Unspecified injury of neck, initial encounter: Secondary | ICD-10-CM | POA: Diagnosis not present

## 2022-12-22 DIAGNOSIS — G319 Degenerative disease of nervous system, unspecified: Secondary | ICD-10-CM | POA: Diagnosis not present

## 2022-12-22 DIAGNOSIS — I2510 Atherosclerotic heart disease of native coronary artery without angina pectoris: Secondary | ICD-10-CM | POA: Diagnosis not present

## 2022-12-22 DIAGNOSIS — R488 Other symbolic dysfunctions: Secondary | ICD-10-CM | POA: Diagnosis not present

## 2022-12-22 DIAGNOSIS — M5481 Occipital neuralgia: Secondary | ICD-10-CM | POA: Diagnosis not present

## 2022-12-22 DIAGNOSIS — I4510 Unspecified right bundle-branch block: Secondary | ICD-10-CM | POA: Diagnosis not present

## 2022-12-22 DIAGNOSIS — M6281 Muscle weakness (generalized): Secondary | ICD-10-CM | POA: Diagnosis not present

## 2022-12-22 DIAGNOSIS — S0990XA Unspecified injury of head, initial encounter: Secondary | ICD-10-CM | POA: Diagnosis not present

## 2022-12-22 DIAGNOSIS — M4319 Spondylolisthesis, multiple sites in spine: Secondary | ICD-10-CM | POA: Diagnosis not present

## 2022-12-22 DIAGNOSIS — I444 Left anterior fascicular block: Secondary | ICD-10-CM | POA: Diagnosis not present

## 2022-12-22 DIAGNOSIS — Z743 Need for continuous supervision: Secondary | ICD-10-CM | POA: Diagnosis not present

## 2022-12-22 DIAGNOSIS — Z7401 Bed confinement status: Secondary | ICD-10-CM | POA: Diagnosis not present

## 2022-12-22 DIAGNOSIS — R2681 Unsteadiness on feet: Secondary | ICD-10-CM | POA: Diagnosis not present

## 2022-12-22 DIAGNOSIS — J984 Other disorders of lung: Secondary | ICD-10-CM | POA: Diagnosis not present

## 2022-12-22 DIAGNOSIS — W19XXXA Unspecified fall, initial encounter: Secondary | ICD-10-CM | POA: Diagnosis not present

## 2022-12-22 DIAGNOSIS — Z741 Need for assistance with personal care: Secondary | ICD-10-CM | POA: Diagnosis not present

## 2022-12-22 DIAGNOSIS — I700 Atherosclerosis of aorta: Secondary | ICD-10-CM | POA: Diagnosis not present

## 2022-12-22 DIAGNOSIS — I10 Essential (primary) hypertension: Secondary | ICD-10-CM | POA: Diagnosis not present

## 2022-12-22 DIAGNOSIS — R410 Disorientation, unspecified: Secondary | ICD-10-CM | POA: Diagnosis not present

## 2022-12-23 DIAGNOSIS — R2681 Unsteadiness on feet: Secondary | ICD-10-CM | POA: Diagnosis not present

## 2022-12-23 DIAGNOSIS — M6281 Muscle weakness (generalized): Secondary | ICD-10-CM | POA: Diagnosis not present

## 2022-12-23 DIAGNOSIS — I4891 Unspecified atrial fibrillation: Secondary | ICD-10-CM | POA: Diagnosis not present

## 2022-12-23 DIAGNOSIS — R488 Other symbolic dysfunctions: Secondary | ICD-10-CM | POA: Diagnosis not present

## 2022-12-23 DIAGNOSIS — Z7409 Other reduced mobility: Secondary | ICD-10-CM | POA: Diagnosis not present

## 2022-12-23 DIAGNOSIS — Z741 Need for assistance with personal care: Secondary | ICD-10-CM | POA: Diagnosis not present

## 2022-12-24 DIAGNOSIS — R2681 Unsteadiness on feet: Secondary | ICD-10-CM | POA: Diagnosis not present

## 2022-12-24 DIAGNOSIS — M6281 Muscle weakness (generalized): Secondary | ICD-10-CM | POA: Diagnosis not present

## 2022-12-24 DIAGNOSIS — R488 Other symbolic dysfunctions: Secondary | ICD-10-CM | POA: Diagnosis not present

## 2022-12-24 DIAGNOSIS — Z741 Need for assistance with personal care: Secondary | ICD-10-CM | POA: Diagnosis not present

## 2022-12-24 DIAGNOSIS — I4891 Unspecified atrial fibrillation: Secondary | ICD-10-CM | POA: Diagnosis not present

## 2022-12-24 DIAGNOSIS — Z7409 Other reduced mobility: Secondary | ICD-10-CM | POA: Diagnosis not present

## 2022-12-25 ENCOUNTER — Telehealth: Payer: Self-pay

## 2022-12-25 DIAGNOSIS — I4891 Unspecified atrial fibrillation: Secondary | ICD-10-CM | POA: Diagnosis not present

## 2022-12-25 DIAGNOSIS — R2681 Unsteadiness on feet: Secondary | ICD-10-CM | POA: Diagnosis not present

## 2022-12-25 DIAGNOSIS — R488 Other symbolic dysfunctions: Secondary | ICD-10-CM | POA: Diagnosis not present

## 2022-12-25 DIAGNOSIS — Z7409 Other reduced mobility: Secondary | ICD-10-CM | POA: Diagnosis not present

## 2022-12-25 DIAGNOSIS — B3731 Acute candidiasis of vulva and vagina: Secondary | ICD-10-CM | POA: Diagnosis not present

## 2022-12-25 DIAGNOSIS — M6281 Muscle weakness (generalized): Secondary | ICD-10-CM | POA: Diagnosis not present

## 2022-12-25 DIAGNOSIS — Z741 Need for assistance with personal care: Secondary | ICD-10-CM | POA: Diagnosis not present

## 2022-12-25 DIAGNOSIS — N390 Urinary tract infection, site not specified: Secondary | ICD-10-CM | POA: Diagnosis not present

## 2022-12-25 DIAGNOSIS — R339 Retention of urine, unspecified: Secondary | ICD-10-CM | POA: Diagnosis not present

## 2022-12-25 NOTE — Telephone Encounter (Signed)
     Patient  visit on 12/22/2022  at [facility] was for treatment.  Patient is in a nursing facility.  Have you been able to follow up with your primary care physician?  The patient was or was not able to obtain any needed medicine or equipment.  Are there diet recommendations that you are having difficulty following?  Patient expresses understanding of discharge instructions and education provided has no other needs at this time.    [facility]   [email or web address]  ?? [phone]   Website: [email or web address]  [email or web address]

## 2022-12-26 DIAGNOSIS — R488 Other symbolic dysfunctions: Secondary | ICD-10-CM | POA: Diagnosis not present

## 2022-12-26 DIAGNOSIS — Z741 Need for assistance with personal care: Secondary | ICD-10-CM | POA: Diagnosis not present

## 2022-12-26 DIAGNOSIS — Z7409 Other reduced mobility: Secondary | ICD-10-CM | POA: Diagnosis not present

## 2022-12-26 DIAGNOSIS — R2681 Unsteadiness on feet: Secondary | ICD-10-CM | POA: Diagnosis not present

## 2022-12-26 DIAGNOSIS — M6281 Muscle weakness (generalized): Secondary | ICD-10-CM | POA: Diagnosis not present

## 2022-12-26 DIAGNOSIS — I4891 Unspecified atrial fibrillation: Secondary | ICD-10-CM | POA: Diagnosis not present

## 2022-12-29 DIAGNOSIS — Z741 Need for assistance with personal care: Secondary | ICD-10-CM | POA: Diagnosis not present

## 2022-12-29 DIAGNOSIS — I4891 Unspecified atrial fibrillation: Secondary | ICD-10-CM | POA: Diagnosis not present

## 2022-12-29 DIAGNOSIS — Z7409 Other reduced mobility: Secondary | ICD-10-CM | POA: Diagnosis not present

## 2022-12-29 DIAGNOSIS — M6281 Muscle weakness (generalized): Secondary | ICD-10-CM | POA: Diagnosis not present

## 2022-12-29 DIAGNOSIS — R488 Other symbolic dysfunctions: Secondary | ICD-10-CM | POA: Diagnosis not present

## 2022-12-29 DIAGNOSIS — R2681 Unsteadiness on feet: Secondary | ICD-10-CM | POA: Diagnosis not present

## 2022-12-30 DIAGNOSIS — R2681 Unsteadiness on feet: Secondary | ICD-10-CM | POA: Diagnosis not present

## 2022-12-30 DIAGNOSIS — R488 Other symbolic dysfunctions: Secondary | ICD-10-CM | POA: Diagnosis not present

## 2022-12-30 DIAGNOSIS — M6281 Muscle weakness (generalized): Secondary | ICD-10-CM | POA: Diagnosis not present

## 2022-12-30 DIAGNOSIS — I4891 Unspecified atrial fibrillation: Secondary | ICD-10-CM | POA: Diagnosis not present

## 2022-12-30 DIAGNOSIS — Z741 Need for assistance with personal care: Secondary | ICD-10-CM | POA: Diagnosis not present

## 2022-12-30 DIAGNOSIS — Z7409 Other reduced mobility: Secondary | ICD-10-CM | POA: Diagnosis not present

## 2022-12-31 DIAGNOSIS — R488 Other symbolic dysfunctions: Secondary | ICD-10-CM | POA: Diagnosis not present

## 2022-12-31 DIAGNOSIS — R2681 Unsteadiness on feet: Secondary | ICD-10-CM | POA: Diagnosis not present

## 2022-12-31 DIAGNOSIS — Z7409 Other reduced mobility: Secondary | ICD-10-CM | POA: Diagnosis not present

## 2022-12-31 DIAGNOSIS — I4891 Unspecified atrial fibrillation: Secondary | ICD-10-CM | POA: Diagnosis not present

## 2022-12-31 DIAGNOSIS — Z741 Need for assistance with personal care: Secondary | ICD-10-CM | POA: Diagnosis not present

## 2022-12-31 DIAGNOSIS — M6281 Muscle weakness (generalized): Secondary | ICD-10-CM | POA: Diagnosis not present

## 2023-01-01 DIAGNOSIS — E039 Hypothyroidism, unspecified: Secondary | ICD-10-CM | POA: Diagnosis not present

## 2023-01-01 DIAGNOSIS — Z043 Encounter for examination and observation following other accident: Secondary | ICD-10-CM | POA: Diagnosis not present

## 2023-01-01 DIAGNOSIS — N390 Urinary tract infection, site not specified: Secondary | ICD-10-CM | POA: Diagnosis not present

## 2023-01-01 DIAGNOSIS — N320 Bladder-neck obstruction: Secondary | ICD-10-CM | POA: Diagnosis not present

## 2023-01-01 DIAGNOSIS — F32A Depression, unspecified: Secondary | ICD-10-CM | POA: Diagnosis not present

## 2023-01-01 DIAGNOSIS — E785 Hyperlipidemia, unspecified: Secondary | ICD-10-CM | POA: Diagnosis not present

## 2023-01-01 DIAGNOSIS — Z7901 Long term (current) use of anticoagulants: Secondary | ICD-10-CM | POA: Diagnosis not present

## 2023-01-01 DIAGNOSIS — F419 Anxiety disorder, unspecified: Secondary | ICD-10-CM | POA: Diagnosis not present

## 2023-01-01 DIAGNOSIS — I509 Heart failure, unspecified: Secondary | ICD-10-CM | POA: Diagnosis not present

## 2023-01-01 DIAGNOSIS — K589 Irritable bowel syndrome without diarrhea: Secondary | ICD-10-CM | POA: Diagnosis not present

## 2023-01-01 DIAGNOSIS — F39 Unspecified mood [affective] disorder: Secondary | ICD-10-CM | POA: Diagnosis not present

## 2023-01-01 DIAGNOSIS — E119 Type 2 diabetes mellitus without complications: Secondary | ICD-10-CM | POA: Diagnosis not present

## 2023-01-01 DIAGNOSIS — M6281 Muscle weakness (generalized): Secondary | ICD-10-CM | POA: Diagnosis not present

## 2023-01-01 DIAGNOSIS — K219 Gastro-esophageal reflux disease without esophagitis: Secondary | ICD-10-CM | POA: Diagnosis not present

## 2023-01-01 DIAGNOSIS — J811 Chronic pulmonary edema: Secondary | ICD-10-CM | POA: Diagnosis not present

## 2023-01-01 DIAGNOSIS — G9341 Metabolic encephalopathy: Secondary | ICD-10-CM | POA: Diagnosis not present

## 2023-01-01 DIAGNOSIS — F0390 Unspecified dementia without behavioral disturbance: Secondary | ICD-10-CM | POA: Diagnosis not present

## 2023-01-01 DIAGNOSIS — Z7409 Other reduced mobility: Secondary | ICD-10-CM | POA: Diagnosis not present

## 2023-01-01 DIAGNOSIS — I10 Essential (primary) hypertension: Secondary | ICD-10-CM | POA: Diagnosis not present

## 2023-01-01 DIAGNOSIS — R531 Weakness: Secondary | ICD-10-CM | POA: Diagnosis not present

## 2023-01-01 DIAGNOSIS — E43 Unspecified severe protein-calorie malnutrition: Secondary | ICD-10-CM | POA: Diagnosis not present

## 2023-01-01 DIAGNOSIS — K5792 Diverticulitis of intestine, part unspecified, without perforation or abscess without bleeding: Secondary | ICD-10-CM | POA: Diagnosis not present

## 2023-01-01 DIAGNOSIS — D509 Iron deficiency anemia, unspecified: Secondary | ICD-10-CM | POA: Diagnosis not present

## 2023-01-01 DIAGNOSIS — I4891 Unspecified atrial fibrillation: Secondary | ICD-10-CM | POA: Diagnosis not present

## 2023-01-01 DIAGNOSIS — R488 Other symbolic dysfunctions: Secondary | ICD-10-CM | POA: Diagnosis not present

## 2023-01-01 DIAGNOSIS — R69 Illness, unspecified: Secondary | ICD-10-CM | POA: Diagnosis not present

## 2023-01-01 DIAGNOSIS — R339 Retention of urine, unspecified: Secondary | ICD-10-CM | POA: Diagnosis not present

## 2023-01-07 DIAGNOSIS — Z043 Encounter for examination and observation following other accident: Secondary | ICD-10-CM | POA: Diagnosis not present

## 2023-01-08 DIAGNOSIS — R69 Illness, unspecified: Secondary | ICD-10-CM | POA: Diagnosis not present

## 2023-01-08 DIAGNOSIS — I4891 Unspecified atrial fibrillation: Secondary | ICD-10-CM | POA: Diagnosis not present

## 2023-01-08 DIAGNOSIS — I509 Heart failure, unspecified: Secondary | ICD-10-CM | POA: Diagnosis not present

## 2023-01-08 DIAGNOSIS — F0390 Unspecified dementia without behavioral disturbance: Secondary | ICD-10-CM | POA: Diagnosis not present

## 2023-01-08 DIAGNOSIS — R339 Retention of urine, unspecified: Secondary | ICD-10-CM | POA: Diagnosis not present

## 2023-01-09 DIAGNOSIS — E43 Unspecified severe protein-calorie malnutrition: Secondary | ICD-10-CM | POA: Diagnosis not present

## 2023-01-09 DIAGNOSIS — E119 Type 2 diabetes mellitus without complications: Secondary | ICD-10-CM | POA: Diagnosis not present

## 2023-01-12 DIAGNOSIS — R488 Other symbolic dysfunctions: Secondary | ICD-10-CM | POA: Diagnosis not present

## 2023-01-12 DIAGNOSIS — Z7409 Other reduced mobility: Secondary | ICD-10-CM | POA: Diagnosis not present

## 2023-01-12 DIAGNOSIS — I4891 Unspecified atrial fibrillation: Secondary | ICD-10-CM | POA: Diagnosis not present

## 2023-01-12 DIAGNOSIS — M6281 Muscle weakness (generalized): Secondary | ICD-10-CM | POA: Diagnosis not present

## 2023-01-12 DIAGNOSIS — Z741 Need for assistance with personal care: Secondary | ICD-10-CM | POA: Diagnosis not present

## 2023-01-13 DIAGNOSIS — Z7409 Other reduced mobility: Secondary | ICD-10-CM | POA: Diagnosis not present

## 2023-01-13 DIAGNOSIS — M6281 Muscle weakness (generalized): Secondary | ICD-10-CM | POA: Diagnosis not present

## 2023-01-13 DIAGNOSIS — R488 Other symbolic dysfunctions: Secondary | ICD-10-CM | POA: Diagnosis not present

## 2023-01-13 DIAGNOSIS — I4891 Unspecified atrial fibrillation: Secondary | ICD-10-CM | POA: Diagnosis not present

## 2023-01-13 DIAGNOSIS — Z741 Need for assistance with personal care: Secondary | ICD-10-CM | POA: Diagnosis not present

## 2023-01-14 DIAGNOSIS — Z7409 Other reduced mobility: Secondary | ICD-10-CM | POA: Diagnosis not present

## 2023-01-14 DIAGNOSIS — M6281 Muscle weakness (generalized): Secondary | ICD-10-CM | POA: Diagnosis not present

## 2023-01-14 DIAGNOSIS — I4891 Unspecified atrial fibrillation: Secondary | ICD-10-CM | POA: Diagnosis not present

## 2023-01-14 DIAGNOSIS — R488 Other symbolic dysfunctions: Secondary | ICD-10-CM | POA: Diagnosis not present

## 2023-01-14 DIAGNOSIS — Z741 Need for assistance with personal care: Secondary | ICD-10-CM | POA: Diagnosis not present

## 2023-01-15 DIAGNOSIS — Z7409 Other reduced mobility: Secondary | ICD-10-CM | POA: Diagnosis not present

## 2023-01-15 DIAGNOSIS — Z741 Need for assistance with personal care: Secondary | ICD-10-CM | POA: Diagnosis not present

## 2023-01-15 DIAGNOSIS — I4891 Unspecified atrial fibrillation: Secondary | ICD-10-CM | POA: Diagnosis not present

## 2023-01-15 DIAGNOSIS — M6281 Muscle weakness (generalized): Secondary | ICD-10-CM | POA: Diagnosis not present

## 2023-01-15 DIAGNOSIS — R488 Other symbolic dysfunctions: Secondary | ICD-10-CM | POA: Diagnosis not present

## 2023-01-16 DIAGNOSIS — Z741 Need for assistance with personal care: Secondary | ICD-10-CM | POA: Diagnosis not present

## 2023-01-16 DIAGNOSIS — R488 Other symbolic dysfunctions: Secondary | ICD-10-CM | POA: Diagnosis not present

## 2023-01-16 DIAGNOSIS — I4891 Unspecified atrial fibrillation: Secondary | ICD-10-CM | POA: Diagnosis not present

## 2023-01-16 DIAGNOSIS — M6281 Muscle weakness (generalized): Secondary | ICD-10-CM | POA: Diagnosis not present

## 2023-01-16 DIAGNOSIS — Z7409 Other reduced mobility: Secondary | ICD-10-CM | POA: Diagnosis not present

## 2023-01-20 DIAGNOSIS — F3289 Other specified depressive episodes: Secondary | ICD-10-CM | POA: Diagnosis not present

## 2023-01-20 DIAGNOSIS — I4891 Unspecified atrial fibrillation: Secondary | ICD-10-CM | POA: Diagnosis not present

## 2023-01-20 DIAGNOSIS — R69 Illness, unspecified: Secondary | ICD-10-CM | POA: Diagnosis not present

## 2023-01-20 DIAGNOSIS — E785 Hyperlipidemia, unspecified: Secondary | ICD-10-CM | POA: Diagnosis not present

## 2023-01-20 DIAGNOSIS — I10 Essential (primary) hypertension: Secondary | ICD-10-CM | POA: Diagnosis not present

## 2023-01-22 DIAGNOSIS — E1159 Type 2 diabetes mellitus with other circulatory complications: Secondary | ICD-10-CM | POA: Diagnosis not present

## 2023-01-22 DIAGNOSIS — M21611 Bunion of right foot: Secondary | ICD-10-CM | POA: Diagnosis not present

## 2023-01-22 DIAGNOSIS — M21612 Bunion of left foot: Secondary | ICD-10-CM | POA: Diagnosis not present

## 2023-01-22 DIAGNOSIS — B351 Tinea unguium: Secondary | ICD-10-CM | POA: Diagnosis not present

## 2023-01-23 DIAGNOSIS — E860 Dehydration: Secondary | ICD-10-CM | POA: Diagnosis not present

## 2023-01-30 DIAGNOSIS — H35033 Hypertensive retinopathy, bilateral: Secondary | ICD-10-CM | POA: Diagnosis not present

## 2023-01-30 DIAGNOSIS — H30132 Disseminated chorioretinal inflammation, generalized, left eye: Secondary | ICD-10-CM | POA: Diagnosis not present

## 2023-01-30 DIAGNOSIS — E119 Type 2 diabetes mellitus without complications: Secondary | ICD-10-CM | POA: Diagnosis not present

## 2023-01-30 DIAGNOSIS — Z961 Presence of intraocular lens: Secondary | ICD-10-CM | POA: Diagnosis not present

## 2023-02-09 DIAGNOSIS — R69 Illness, unspecified: Secondary | ICD-10-CM | POA: Diagnosis not present

## 2023-02-09 DIAGNOSIS — I4891 Unspecified atrial fibrillation: Secondary | ICD-10-CM | POA: Diagnosis not present

## 2023-02-09 DIAGNOSIS — I10 Essential (primary) hypertension: Secondary | ICD-10-CM | POA: Diagnosis not present

## 2023-02-11 DIAGNOSIS — N390 Urinary tract infection, site not specified: Secondary | ICD-10-CM | POA: Diagnosis not present

## 2023-02-11 DIAGNOSIS — R339 Retention of urine, unspecified: Secondary | ICD-10-CM | POA: Diagnosis not present

## 2023-02-11 DIAGNOSIS — K623 Rectal prolapse: Secondary | ICD-10-CM | POA: Diagnosis not present

## 2023-02-19 DIAGNOSIS — Z7409 Other reduced mobility: Secondary | ICD-10-CM | POA: Diagnosis not present

## 2023-02-19 DIAGNOSIS — R2681 Unsteadiness on feet: Secondary | ICD-10-CM | POA: Diagnosis not present

## 2023-02-19 DIAGNOSIS — Z741 Need for assistance with personal care: Secondary | ICD-10-CM | POA: Diagnosis not present

## 2023-02-19 DIAGNOSIS — I4891 Unspecified atrial fibrillation: Secondary | ICD-10-CM | POA: Diagnosis not present

## 2023-02-20 DIAGNOSIS — Z7409 Other reduced mobility: Secondary | ICD-10-CM | POA: Diagnosis not present

## 2023-02-20 DIAGNOSIS — I4891 Unspecified atrial fibrillation: Secondary | ICD-10-CM | POA: Diagnosis not present

## 2023-02-20 DIAGNOSIS — R2681 Unsteadiness on feet: Secondary | ICD-10-CM | POA: Diagnosis not present

## 2023-02-20 DIAGNOSIS — Z741 Need for assistance with personal care: Secondary | ICD-10-CM | POA: Diagnosis not present

## 2023-02-23 DIAGNOSIS — Z741 Need for assistance with personal care: Secondary | ICD-10-CM | POA: Diagnosis not present

## 2023-02-23 DIAGNOSIS — Z7409 Other reduced mobility: Secondary | ICD-10-CM | POA: Diagnosis not present

## 2023-02-23 DIAGNOSIS — R2681 Unsteadiness on feet: Secondary | ICD-10-CM | POA: Diagnosis not present

## 2023-02-23 DIAGNOSIS — I4891 Unspecified atrial fibrillation: Secondary | ICD-10-CM | POA: Diagnosis not present

## 2023-02-24 DIAGNOSIS — I4891 Unspecified atrial fibrillation: Secondary | ICD-10-CM | POA: Diagnosis not present

## 2023-02-24 DIAGNOSIS — R2681 Unsteadiness on feet: Secondary | ICD-10-CM | POA: Diagnosis not present

## 2023-02-24 DIAGNOSIS — Z7409 Other reduced mobility: Secondary | ICD-10-CM | POA: Diagnosis not present

## 2023-02-24 DIAGNOSIS — Z741 Need for assistance with personal care: Secondary | ICD-10-CM | POA: Diagnosis not present

## 2023-02-25 DIAGNOSIS — I4891 Unspecified atrial fibrillation: Secondary | ICD-10-CM | POA: Diagnosis not present

## 2023-02-25 DIAGNOSIS — Z7409 Other reduced mobility: Secondary | ICD-10-CM | POA: Diagnosis not present

## 2023-02-25 DIAGNOSIS — Z741 Need for assistance with personal care: Secondary | ICD-10-CM | POA: Diagnosis not present

## 2023-02-25 DIAGNOSIS — R2681 Unsteadiness on feet: Secondary | ICD-10-CM | POA: Diagnosis not present

## 2023-02-26 DIAGNOSIS — Z7409 Other reduced mobility: Secondary | ICD-10-CM | POA: Diagnosis not present

## 2023-02-26 DIAGNOSIS — I4891 Unspecified atrial fibrillation: Secondary | ICD-10-CM | POA: Diagnosis not present

## 2023-02-26 DIAGNOSIS — R2681 Unsteadiness on feet: Secondary | ICD-10-CM | POA: Diagnosis not present

## 2023-02-26 DIAGNOSIS — Z741 Need for assistance with personal care: Secondary | ICD-10-CM | POA: Diagnosis not present

## 2023-02-27 DIAGNOSIS — I4891 Unspecified atrial fibrillation: Secondary | ICD-10-CM | POA: Diagnosis not present

## 2023-02-27 DIAGNOSIS — Z741 Need for assistance with personal care: Secondary | ICD-10-CM | POA: Diagnosis not present

## 2023-02-27 DIAGNOSIS — R2681 Unsteadiness on feet: Secondary | ICD-10-CM | POA: Diagnosis not present

## 2023-02-27 DIAGNOSIS — Z7409 Other reduced mobility: Secondary | ICD-10-CM | POA: Diagnosis not present

## 2023-03-02 DIAGNOSIS — I4891 Unspecified atrial fibrillation: Secondary | ICD-10-CM | POA: Diagnosis not present

## 2023-03-02 DIAGNOSIS — Z741 Need for assistance with personal care: Secondary | ICD-10-CM | POA: Diagnosis not present

## 2023-03-02 DIAGNOSIS — R2681 Unsteadiness on feet: Secondary | ICD-10-CM | POA: Diagnosis not present

## 2023-03-02 DIAGNOSIS — Z7409 Other reduced mobility: Secondary | ICD-10-CM | POA: Diagnosis not present

## 2023-03-03 DIAGNOSIS — R2681 Unsteadiness on feet: Secondary | ICD-10-CM | POA: Diagnosis not present

## 2023-03-03 DIAGNOSIS — Z7409 Other reduced mobility: Secondary | ICD-10-CM | POA: Diagnosis not present

## 2023-03-03 DIAGNOSIS — I4891 Unspecified atrial fibrillation: Secondary | ICD-10-CM | POA: Diagnosis not present

## 2023-03-03 DIAGNOSIS — Z741 Need for assistance with personal care: Secondary | ICD-10-CM | POA: Diagnosis not present

## 2023-03-04 DIAGNOSIS — Z741 Need for assistance with personal care: Secondary | ICD-10-CM | POA: Diagnosis not present

## 2023-03-04 DIAGNOSIS — Z7409 Other reduced mobility: Secondary | ICD-10-CM | POA: Diagnosis not present

## 2023-03-04 DIAGNOSIS — I4891 Unspecified atrial fibrillation: Secondary | ICD-10-CM | POA: Diagnosis not present

## 2023-03-04 DIAGNOSIS — R2681 Unsteadiness on feet: Secondary | ICD-10-CM | POA: Diagnosis not present

## 2023-03-05 DIAGNOSIS — R2681 Unsteadiness on feet: Secondary | ICD-10-CM | POA: Diagnosis not present

## 2023-03-05 DIAGNOSIS — Z7409 Other reduced mobility: Secondary | ICD-10-CM | POA: Diagnosis not present

## 2023-03-05 DIAGNOSIS — I4891 Unspecified atrial fibrillation: Secondary | ICD-10-CM | POA: Diagnosis not present

## 2023-03-05 DIAGNOSIS — Z741 Need for assistance with personal care: Secondary | ICD-10-CM | POA: Diagnosis not present

## 2023-03-06 DIAGNOSIS — R2681 Unsteadiness on feet: Secondary | ICD-10-CM | POA: Diagnosis not present

## 2023-03-06 DIAGNOSIS — Z741 Need for assistance with personal care: Secondary | ICD-10-CM | POA: Diagnosis not present

## 2023-03-06 DIAGNOSIS — Z7409 Other reduced mobility: Secondary | ICD-10-CM | POA: Diagnosis not present

## 2023-03-06 DIAGNOSIS — I4891 Unspecified atrial fibrillation: Secondary | ICD-10-CM | POA: Diagnosis not present

## 2023-03-09 DIAGNOSIS — Z741 Need for assistance with personal care: Secondary | ICD-10-CM | POA: Diagnosis not present

## 2023-03-09 DIAGNOSIS — I4891 Unspecified atrial fibrillation: Secondary | ICD-10-CM | POA: Diagnosis not present

## 2023-03-09 DIAGNOSIS — Z7409 Other reduced mobility: Secondary | ICD-10-CM | POA: Diagnosis not present

## 2023-03-09 DIAGNOSIS — R2681 Unsteadiness on feet: Secondary | ICD-10-CM | POA: Diagnosis not present

## 2023-03-10 DIAGNOSIS — Z7409 Other reduced mobility: Secondary | ICD-10-CM | POA: Diagnosis not present

## 2023-03-10 DIAGNOSIS — I4891 Unspecified atrial fibrillation: Secondary | ICD-10-CM | POA: Diagnosis not present

## 2023-03-10 DIAGNOSIS — Z741 Need for assistance with personal care: Secondary | ICD-10-CM | POA: Diagnosis not present

## 2023-03-10 DIAGNOSIS — R2681 Unsteadiness on feet: Secondary | ICD-10-CM | POA: Diagnosis not present

## 2023-03-11 DIAGNOSIS — Z7409 Other reduced mobility: Secondary | ICD-10-CM | POA: Diagnosis not present

## 2023-03-11 DIAGNOSIS — I4891 Unspecified atrial fibrillation: Secondary | ICD-10-CM | POA: Diagnosis not present

## 2023-03-11 DIAGNOSIS — Z741 Need for assistance with personal care: Secondary | ICD-10-CM | POA: Diagnosis not present

## 2023-03-11 DIAGNOSIS — R2681 Unsteadiness on feet: Secondary | ICD-10-CM | POA: Diagnosis not present

## 2023-03-12 DIAGNOSIS — Z741 Need for assistance with personal care: Secondary | ICD-10-CM | POA: Diagnosis not present

## 2023-03-12 DIAGNOSIS — F03918 Unspecified dementia, unspecified severity, with other behavioral disturbance: Secondary | ICD-10-CM | POA: Diagnosis not present

## 2023-03-12 DIAGNOSIS — Z7409 Other reduced mobility: Secondary | ICD-10-CM | POA: Diagnosis not present

## 2023-03-12 DIAGNOSIS — R2681 Unsteadiness on feet: Secondary | ICD-10-CM | POA: Diagnosis not present

## 2023-03-12 DIAGNOSIS — G3184 Mild cognitive impairment, so stated: Secondary | ICD-10-CM | POA: Diagnosis not present

## 2023-03-12 DIAGNOSIS — F419 Anxiety disorder, unspecified: Secondary | ICD-10-CM | POA: Diagnosis not present

## 2023-03-12 DIAGNOSIS — R69 Illness, unspecified: Secondary | ICD-10-CM | POA: Diagnosis not present

## 2023-03-12 DIAGNOSIS — I4891 Unspecified atrial fibrillation: Secondary | ICD-10-CM | POA: Diagnosis not present

## 2023-03-12 DIAGNOSIS — F32A Depression, unspecified: Secondary | ICD-10-CM | POA: Diagnosis not present

## 2023-03-13 DIAGNOSIS — R2681 Unsteadiness on feet: Secondary | ICD-10-CM | POA: Diagnosis not present

## 2023-03-13 DIAGNOSIS — Z741 Need for assistance with personal care: Secondary | ICD-10-CM | POA: Diagnosis not present

## 2023-03-13 DIAGNOSIS — I4891 Unspecified atrial fibrillation: Secondary | ICD-10-CM | POA: Diagnosis not present

## 2023-03-13 DIAGNOSIS — Z7409 Other reduced mobility: Secondary | ICD-10-CM | POA: Diagnosis not present

## 2023-03-16 DIAGNOSIS — F419 Anxiety disorder, unspecified: Secondary | ICD-10-CM | POA: Diagnosis not present

## 2023-03-16 DIAGNOSIS — R69 Illness, unspecified: Secondary | ICD-10-CM | POA: Diagnosis not present

## 2023-03-16 DIAGNOSIS — F32A Depression, unspecified: Secondary | ICD-10-CM | POA: Diagnosis not present

## 2023-03-16 DIAGNOSIS — Z741 Need for assistance with personal care: Secondary | ICD-10-CM | POA: Diagnosis not present

## 2023-03-16 DIAGNOSIS — R2681 Unsteadiness on feet: Secondary | ICD-10-CM | POA: Diagnosis not present

## 2023-03-16 DIAGNOSIS — Z7409 Other reduced mobility: Secondary | ICD-10-CM | POA: Diagnosis not present

## 2023-03-16 DIAGNOSIS — I4891 Unspecified atrial fibrillation: Secondary | ICD-10-CM | POA: Diagnosis not present

## 2023-03-16 DIAGNOSIS — Z5181 Encounter for therapeutic drug level monitoring: Secondary | ICD-10-CM | POA: Diagnosis not present

## 2023-03-17 DIAGNOSIS — R2681 Unsteadiness on feet: Secondary | ICD-10-CM | POA: Diagnosis not present

## 2023-03-17 DIAGNOSIS — Z7409 Other reduced mobility: Secondary | ICD-10-CM | POA: Diagnosis not present

## 2023-03-17 DIAGNOSIS — Z741 Need for assistance with personal care: Secondary | ICD-10-CM | POA: Diagnosis not present

## 2023-03-17 DIAGNOSIS — I4891 Unspecified atrial fibrillation: Secondary | ICD-10-CM | POA: Diagnosis not present

## 2023-03-18 DIAGNOSIS — Z7409 Other reduced mobility: Secondary | ICD-10-CM | POA: Diagnosis not present

## 2023-03-18 DIAGNOSIS — R2681 Unsteadiness on feet: Secondary | ICD-10-CM | POA: Diagnosis not present

## 2023-03-18 DIAGNOSIS — I4891 Unspecified atrial fibrillation: Secondary | ICD-10-CM | POA: Diagnosis not present

## 2023-03-18 DIAGNOSIS — Z741 Need for assistance with personal care: Secondary | ICD-10-CM | POA: Diagnosis not present

## 2023-03-19 DIAGNOSIS — F419 Anxiety disorder, unspecified: Secondary | ICD-10-CM | POA: Diagnosis not present

## 2023-03-19 DIAGNOSIS — F32A Depression, unspecified: Secondary | ICD-10-CM | POA: Diagnosis not present

## 2023-03-19 DIAGNOSIS — F03918 Unspecified dementia, unspecified severity, with other behavioral disturbance: Secondary | ICD-10-CM | POA: Diagnosis not present

## 2023-03-19 DIAGNOSIS — R69 Illness, unspecified: Secondary | ICD-10-CM | POA: Diagnosis not present

## 2023-03-19 DIAGNOSIS — G3184 Mild cognitive impairment, so stated: Secondary | ICD-10-CM | POA: Diagnosis not present

## 2023-03-20 DIAGNOSIS — Z7409 Other reduced mobility: Secondary | ICD-10-CM | POA: Diagnosis not present

## 2023-03-20 DIAGNOSIS — I4891 Unspecified atrial fibrillation: Secondary | ICD-10-CM | POA: Diagnosis not present

## 2023-03-20 DIAGNOSIS — Z741 Need for assistance with personal care: Secondary | ICD-10-CM | POA: Diagnosis not present

## 2023-03-20 DIAGNOSIS — R2681 Unsteadiness on feet: Secondary | ICD-10-CM | POA: Diagnosis not present

## 2023-03-22 DIAGNOSIS — I4891 Unspecified atrial fibrillation: Secondary | ICD-10-CM | POA: Diagnosis not present

## 2023-03-22 DIAGNOSIS — Z7409 Other reduced mobility: Secondary | ICD-10-CM | POA: Diagnosis not present

## 2023-03-22 DIAGNOSIS — Z741 Need for assistance with personal care: Secondary | ICD-10-CM | POA: Diagnosis not present

## 2023-03-22 DIAGNOSIS — R2681 Unsteadiness on feet: Secondary | ICD-10-CM | POA: Diagnosis not present

## 2023-03-23 DIAGNOSIS — Z7409 Other reduced mobility: Secondary | ICD-10-CM | POA: Diagnosis not present

## 2023-03-23 DIAGNOSIS — Z741 Need for assistance with personal care: Secondary | ICD-10-CM | POA: Diagnosis not present

## 2023-03-23 DIAGNOSIS — I4891 Unspecified atrial fibrillation: Secondary | ICD-10-CM | POA: Diagnosis not present

## 2023-03-23 DIAGNOSIS — R2681 Unsteadiness on feet: Secondary | ICD-10-CM | POA: Diagnosis not present

## 2023-03-24 DIAGNOSIS — R69 Illness, unspecified: Secondary | ICD-10-CM | POA: Diagnosis not present

## 2023-03-24 DIAGNOSIS — R2681 Unsteadiness on feet: Secondary | ICD-10-CM | POA: Diagnosis not present

## 2023-03-24 DIAGNOSIS — Z5181 Encounter for therapeutic drug level monitoring: Secondary | ICD-10-CM | POA: Diagnosis not present

## 2023-03-24 DIAGNOSIS — Z741 Need for assistance with personal care: Secondary | ICD-10-CM | POA: Diagnosis not present

## 2023-03-24 DIAGNOSIS — I10 Essential (primary) hypertension: Secondary | ICD-10-CM | POA: Diagnosis not present

## 2023-03-24 DIAGNOSIS — F01B2 Vascular dementia, moderate, with psychotic disturbance: Secondary | ICD-10-CM | POA: Diagnosis not present

## 2023-03-24 DIAGNOSIS — I4891 Unspecified atrial fibrillation: Secondary | ICD-10-CM | POA: Diagnosis not present

## 2023-03-24 DIAGNOSIS — F32A Depression, unspecified: Secondary | ICD-10-CM | POA: Diagnosis not present

## 2023-03-24 DIAGNOSIS — F419 Anxiety disorder, unspecified: Secondary | ICD-10-CM | POA: Diagnosis not present

## 2023-03-24 DIAGNOSIS — F0390 Unspecified dementia without behavioral disturbance: Secondary | ICD-10-CM | POA: Diagnosis not present

## 2023-03-24 DIAGNOSIS — F411 Generalized anxiety disorder: Secondary | ICD-10-CM | POA: Diagnosis not present

## 2023-03-24 DIAGNOSIS — Z7409 Other reduced mobility: Secondary | ICD-10-CM | POA: Diagnosis not present

## 2023-03-24 DIAGNOSIS — F5105 Insomnia due to other mental disorder: Secondary | ICD-10-CM | POA: Diagnosis not present

## 2023-03-26 DIAGNOSIS — R2681 Unsteadiness on feet: Secondary | ICD-10-CM | POA: Diagnosis not present

## 2023-03-26 DIAGNOSIS — Z7409 Other reduced mobility: Secondary | ICD-10-CM | POA: Diagnosis not present

## 2023-03-26 DIAGNOSIS — I4891 Unspecified atrial fibrillation: Secondary | ICD-10-CM | POA: Diagnosis not present

## 2023-03-26 DIAGNOSIS — Z741 Need for assistance with personal care: Secondary | ICD-10-CM | POA: Diagnosis not present

## 2023-03-27 DIAGNOSIS — I4891 Unspecified atrial fibrillation: Secondary | ICD-10-CM | POA: Diagnosis not present

## 2023-03-27 DIAGNOSIS — Z7409 Other reduced mobility: Secondary | ICD-10-CM | POA: Diagnosis not present

## 2023-03-27 DIAGNOSIS — Z741 Need for assistance with personal care: Secondary | ICD-10-CM | POA: Diagnosis not present

## 2023-03-27 DIAGNOSIS — R2681 Unsteadiness on feet: Secondary | ICD-10-CM | POA: Diagnosis not present

## 2023-03-30 DIAGNOSIS — R2681 Unsteadiness on feet: Secondary | ICD-10-CM | POA: Diagnosis not present

## 2023-03-30 DIAGNOSIS — Z7409 Other reduced mobility: Secondary | ICD-10-CM | POA: Diagnosis not present

## 2023-03-30 DIAGNOSIS — Z741 Need for assistance with personal care: Secondary | ICD-10-CM | POA: Diagnosis not present

## 2023-03-30 DIAGNOSIS — I4891 Unspecified atrial fibrillation: Secondary | ICD-10-CM | POA: Diagnosis not present

## 2023-03-31 DIAGNOSIS — R2681 Unsteadiness on feet: Secondary | ICD-10-CM | POA: Diagnosis not present

## 2023-03-31 DIAGNOSIS — Z741 Need for assistance with personal care: Secondary | ICD-10-CM | POA: Diagnosis not present

## 2023-03-31 DIAGNOSIS — I4891 Unspecified atrial fibrillation: Secondary | ICD-10-CM | POA: Diagnosis not present

## 2023-03-31 DIAGNOSIS — Z7409 Other reduced mobility: Secondary | ICD-10-CM | POA: Diagnosis not present

## 2023-04-01 DIAGNOSIS — Z7409 Other reduced mobility: Secondary | ICD-10-CM | POA: Diagnosis not present

## 2023-04-01 DIAGNOSIS — Z741 Need for assistance with personal care: Secondary | ICD-10-CM | POA: Diagnosis not present

## 2023-04-01 DIAGNOSIS — R2681 Unsteadiness on feet: Secondary | ICD-10-CM | POA: Diagnosis not present

## 2023-04-01 DIAGNOSIS — I4891 Unspecified atrial fibrillation: Secondary | ICD-10-CM | POA: Diagnosis not present

## 2023-04-02 DIAGNOSIS — Z5181 Encounter for therapeutic drug level monitoring: Secondary | ICD-10-CM | POA: Diagnosis not present

## 2023-04-02 DIAGNOSIS — Z7409 Other reduced mobility: Secondary | ICD-10-CM | POA: Diagnosis not present

## 2023-04-02 DIAGNOSIS — R2681 Unsteadiness on feet: Secondary | ICD-10-CM | POA: Diagnosis not present

## 2023-04-02 DIAGNOSIS — F32A Depression, unspecified: Secondary | ICD-10-CM | POA: Diagnosis not present

## 2023-04-02 DIAGNOSIS — I4891 Unspecified atrial fibrillation: Secondary | ICD-10-CM | POA: Diagnosis not present

## 2023-04-02 DIAGNOSIS — F39 Unspecified mood [affective] disorder: Secondary | ICD-10-CM | POA: Diagnosis not present

## 2023-04-02 DIAGNOSIS — F419 Anxiety disorder, unspecified: Secondary | ICD-10-CM | POA: Diagnosis not present

## 2023-04-02 DIAGNOSIS — Z741 Need for assistance with personal care: Secondary | ICD-10-CM | POA: Diagnosis not present

## 2023-04-06 DIAGNOSIS — F03918 Unspecified dementia, unspecified severity, with other behavioral disturbance: Secondary | ICD-10-CM | POA: Diagnosis not present

## 2023-04-06 DIAGNOSIS — I4891 Unspecified atrial fibrillation: Secondary | ICD-10-CM | POA: Diagnosis not present

## 2023-04-06 DIAGNOSIS — I10 Essential (primary) hypertension: Secondary | ICD-10-CM | POA: Diagnosis not present

## 2023-04-06 DIAGNOSIS — F32A Depression, unspecified: Secondary | ICD-10-CM | POA: Diagnosis not present

## 2023-04-08 DIAGNOSIS — N390 Urinary tract infection, site not specified: Secondary | ICD-10-CM | POA: Diagnosis not present

## 2023-04-08 DIAGNOSIS — F03918 Unspecified dementia, unspecified severity, with other behavioral disturbance: Secondary | ICD-10-CM | POA: Diagnosis not present

## 2023-04-08 DIAGNOSIS — R339 Retention of urine, unspecified: Secondary | ICD-10-CM | POA: Diagnosis not present

## 2023-04-08 DIAGNOSIS — Z5181 Encounter for therapeutic drug level monitoring: Secondary | ICD-10-CM | POA: Diagnosis not present

## 2023-04-09 DIAGNOSIS — E860 Dehydration: Secondary | ICD-10-CM | POA: Diagnosis not present

## 2023-04-09 DIAGNOSIS — N179 Acute kidney failure, unspecified: Secondary | ICD-10-CM | POA: Diagnosis not present

## 2023-04-09 DIAGNOSIS — I10 Essential (primary) hypertension: Secondary | ICD-10-CM | POA: Diagnosis not present

## 2023-04-09 DIAGNOSIS — E119 Type 2 diabetes mellitus without complications: Secondary | ICD-10-CM | POA: Diagnosis not present

## 2023-04-09 DIAGNOSIS — R7989 Other specified abnormal findings of blood chemistry: Secondary | ICD-10-CM | POA: Diagnosis not present

## 2023-04-09 DIAGNOSIS — N183 Chronic kidney disease, stage 3 unspecified: Secondary | ICD-10-CM | POA: Diagnosis not present

## 2023-04-15 DIAGNOSIS — Z7189 Other specified counseling: Secondary | ICD-10-CM | POA: Diagnosis not present

## 2023-04-15 DIAGNOSIS — E1159 Type 2 diabetes mellitus with other circulatory complications: Secondary | ICD-10-CM | POA: Diagnosis not present

## 2023-04-15 DIAGNOSIS — B351 Tinea unguium: Secondary | ICD-10-CM | POA: Diagnosis not present

## 2023-04-16 DIAGNOSIS — E860 Dehydration: Secondary | ICD-10-CM | POA: Diagnosis not present

## 2023-04-16 DIAGNOSIS — N179 Acute kidney failure, unspecified: Secondary | ICD-10-CM | POA: Diagnosis not present

## 2023-04-16 DIAGNOSIS — E119 Type 2 diabetes mellitus without complications: Secondary | ICD-10-CM | POA: Diagnosis not present

## 2023-04-16 DIAGNOSIS — N183 Chronic kidney disease, stage 3 unspecified: Secondary | ICD-10-CM | POA: Diagnosis not present

## 2023-04-16 DIAGNOSIS — R7989 Other specified abnormal findings of blood chemistry: Secondary | ICD-10-CM | POA: Diagnosis not present

## 2023-05-13 DIAGNOSIS — F419 Anxiety disorder, unspecified: Secondary | ICD-10-CM | POA: Diagnosis not present

## 2023-05-13 DIAGNOSIS — G3184 Mild cognitive impairment, so stated: Secondary | ICD-10-CM | POA: Diagnosis not present

## 2023-05-13 DIAGNOSIS — F32A Depression, unspecified: Secondary | ICD-10-CM | POA: Diagnosis not present

## 2023-05-13 DIAGNOSIS — F03918 Unspecified dementia, unspecified severity, with other behavioral disturbance: Secondary | ICD-10-CM | POA: Diagnosis not present

## 2023-05-20 DIAGNOSIS — Z043 Encounter for examination and observation following other accident: Secondary | ICD-10-CM | POA: Diagnosis not present

## 2023-05-20 DIAGNOSIS — S70212A Abrasion, left hip, initial encounter: Secondary | ICD-10-CM | POA: Diagnosis not present

## 2023-05-21 DIAGNOSIS — F32A Depression, unspecified: Secondary | ICD-10-CM | POA: Diagnosis not present

## 2023-05-21 DIAGNOSIS — Z5181 Encounter for therapeutic drug level monitoring: Secondary | ICD-10-CM | POA: Diagnosis not present

## 2023-05-21 DIAGNOSIS — F419 Anxiety disorder, unspecified: Secondary | ICD-10-CM | POA: Diagnosis not present

## 2023-05-21 DIAGNOSIS — F03918 Unspecified dementia, unspecified severity, with other behavioral disturbance: Secondary | ICD-10-CM | POA: Diagnosis not present

## 2023-05-26 DIAGNOSIS — R0981 Nasal congestion: Secondary | ICD-10-CM | POA: Diagnosis not present

## 2023-05-26 DIAGNOSIS — I517 Cardiomegaly: Secondary | ICD-10-CM | POA: Diagnosis not present

## 2023-05-26 DIAGNOSIS — R059 Cough, unspecified: Secondary | ICD-10-CM | POA: Diagnosis not present

## 2023-05-27 DIAGNOSIS — E8770 Fluid overload, unspecified: Secondary | ICD-10-CM | POA: Diagnosis not present

## 2023-05-27 DIAGNOSIS — I517 Cardiomegaly: Secondary | ICD-10-CM | POA: Diagnosis not present

## 2023-05-27 DIAGNOSIS — I509 Heart failure, unspecified: Secondary | ICD-10-CM | POA: Diagnosis not present

## 2023-06-01 DIAGNOSIS — I10 Essential (primary) hypertension: Secondary | ICD-10-CM | POA: Diagnosis not present

## 2023-06-01 DIAGNOSIS — I5020 Unspecified systolic (congestive) heart failure: Secondary | ICD-10-CM | POA: Diagnosis not present

## 2023-06-02 DIAGNOSIS — I509 Heart failure, unspecified: Secondary | ICD-10-CM | POA: Diagnosis not present

## 2023-06-02 DIAGNOSIS — E8770 Fluid overload, unspecified: Secondary | ICD-10-CM | POA: Diagnosis not present

## 2023-06-02 DIAGNOSIS — I517 Cardiomegaly: Secondary | ICD-10-CM | POA: Diagnosis not present

## 2023-06-08 ENCOUNTER — Ambulatory Visit: Payer: Medicare HMO | Admitting: Cardiology

## 2023-06-09 DIAGNOSIS — R52 Pain, unspecified: Secondary | ICD-10-CM | POA: Diagnosis not present

## 2023-06-09 DIAGNOSIS — I509 Heart failure, unspecified: Secondary | ICD-10-CM | POA: Diagnosis not present

## 2023-06-09 DIAGNOSIS — M19071 Primary osteoarthritis, right ankle and foot: Secondary | ICD-10-CM | POA: Diagnosis not present

## 2023-06-10 DIAGNOSIS — N179 Acute kidney failure, unspecified: Secondary | ICD-10-CM | POA: Diagnosis not present

## 2023-06-10 DIAGNOSIS — E8770 Fluid overload, unspecified: Secondary | ICD-10-CM | POA: Diagnosis not present

## 2023-06-10 DIAGNOSIS — E878 Other disorders of electrolyte and fluid balance, not elsewhere classified: Secondary | ICD-10-CM | POA: Diagnosis not present

## 2023-06-10 DIAGNOSIS — N390 Urinary tract infection, site not specified: Secondary | ICD-10-CM | POA: Diagnosis not present

## 2023-06-11 DIAGNOSIS — I130 Hypertensive heart and chronic kidney disease with heart failure and stage 1 through stage 4 chronic kidney disease, or unspecified chronic kidney disease: Secondary | ICD-10-CM | POA: Diagnosis not present

## 2023-06-11 DIAGNOSIS — E8770 Fluid overload, unspecified: Secondary | ICD-10-CM | POA: Diagnosis not present

## 2023-06-11 DIAGNOSIS — N179 Acute kidney failure, unspecified: Secondary | ICD-10-CM | POA: Diagnosis not present

## 2023-06-11 DIAGNOSIS — E878 Other disorders of electrolyte and fluid balance, not elsewhere classified: Secondary | ICD-10-CM | POA: Diagnosis not present

## 2023-06-15 DIAGNOSIS — F32A Depression, unspecified: Secondary | ICD-10-CM | POA: Diagnosis not present

## 2023-06-15 DIAGNOSIS — N179 Acute kidney failure, unspecified: Secondary | ICD-10-CM | POA: Diagnosis not present

## 2023-06-15 DIAGNOSIS — F0390 Unspecified dementia without behavioral disturbance: Secondary | ICD-10-CM | POA: Diagnosis not present

## 2023-06-15 DIAGNOSIS — R609 Edema, unspecified: Secondary | ICD-10-CM | POA: Diagnosis not present

## 2023-06-15 DIAGNOSIS — I5020 Unspecified systolic (congestive) heart failure: Secondary | ICD-10-CM | POA: Diagnosis not present

## 2023-06-15 DIAGNOSIS — F419 Anxiety disorder, unspecified: Secondary | ICD-10-CM | POA: Diagnosis not present

## 2023-06-16 DIAGNOSIS — N179 Acute kidney failure, unspecified: Secondary | ICD-10-CM | POA: Diagnosis not present

## 2023-06-16 DIAGNOSIS — Z5181 Encounter for therapeutic drug level monitoring: Secondary | ICD-10-CM | POA: Diagnosis not present

## 2023-06-16 DIAGNOSIS — E8770 Fluid overload, unspecified: Secondary | ICD-10-CM | POA: Diagnosis not present

## 2023-06-17 DIAGNOSIS — G8929 Other chronic pain: Secondary | ICD-10-CM | POA: Diagnosis not present

## 2023-06-17 DIAGNOSIS — E8770 Fluid overload, unspecified: Secondary | ICD-10-CM | POA: Diagnosis not present

## 2023-06-17 DIAGNOSIS — M11241 Other chondrocalcinosis, right hand: Secondary | ICD-10-CM | POA: Diagnosis not present

## 2023-06-17 DIAGNOSIS — M19041 Primary osteoarthritis, right hand: Secondary | ICD-10-CM | POA: Diagnosis not present

## 2023-06-17 DIAGNOSIS — R609 Edema, unspecified: Secondary | ICD-10-CM | POA: Diagnosis not present

## 2023-06-17 DIAGNOSIS — R52 Pain, unspecified: Secondary | ICD-10-CM | POA: Diagnosis not present

## 2023-06-18 DIAGNOSIS — M79621 Pain in right upper arm: Secondary | ICD-10-CM | POA: Diagnosis not present

## 2023-06-23 DIAGNOSIS — I10 Essential (primary) hypertension: Secondary | ICD-10-CM | POA: Diagnosis not present

## 2023-06-23 DIAGNOSIS — F5105 Insomnia due to other mental disorder: Secondary | ICD-10-CM | POA: Diagnosis not present

## 2023-06-23 DIAGNOSIS — I5020 Unspecified systolic (congestive) heart failure: Secondary | ICD-10-CM | POA: Diagnosis not present

## 2023-06-23 DIAGNOSIS — F411 Generalized anxiety disorder: Secondary | ICD-10-CM | POA: Diagnosis not present

## 2023-06-23 DIAGNOSIS — F01B2 Vascular dementia, moderate, with psychotic disturbance: Secondary | ICD-10-CM | POA: Diagnosis not present

## 2023-06-24 DIAGNOSIS — N179 Acute kidney failure, unspecified: Secondary | ICD-10-CM | POA: Diagnosis not present

## 2023-06-24 DIAGNOSIS — E8770 Fluid overload, unspecified: Secondary | ICD-10-CM | POA: Diagnosis not present

## 2023-06-25 DIAGNOSIS — E1159 Type 2 diabetes mellitus with other circulatory complications: Secondary | ICD-10-CM | POA: Diagnosis not present

## 2023-06-25 DIAGNOSIS — B351 Tinea unguium: Secondary | ICD-10-CM | POA: Diagnosis not present

## 2023-06-25 DIAGNOSIS — E8770 Fluid overload, unspecified: Secondary | ICD-10-CM | POA: Diagnosis not present

## 2023-06-26 DIAGNOSIS — I501 Left ventricular failure: Secondary | ICD-10-CM | POA: Diagnosis not present

## 2023-06-26 DIAGNOSIS — R944 Abnormal results of kidney function studies: Secondary | ICD-10-CM | POA: Diagnosis not present

## 2023-06-29 DIAGNOSIS — R2681 Unsteadiness on feet: Secondary | ICD-10-CM | POA: Diagnosis not present

## 2023-06-29 DIAGNOSIS — M6281 Muscle weakness (generalized): Secondary | ICD-10-CM | POA: Diagnosis not present

## 2023-06-29 DIAGNOSIS — I4891 Unspecified atrial fibrillation: Secondary | ICD-10-CM | POA: Diagnosis not present

## 2023-06-30 DIAGNOSIS — M6281 Muscle weakness (generalized): Secondary | ICD-10-CM | POA: Diagnosis not present

## 2023-06-30 DIAGNOSIS — R2681 Unsteadiness on feet: Secondary | ICD-10-CM | POA: Diagnosis not present

## 2023-06-30 DIAGNOSIS — I4891 Unspecified atrial fibrillation: Secondary | ICD-10-CM | POA: Diagnosis not present

## 2023-06-30 DIAGNOSIS — E8770 Fluid overload, unspecified: Secondary | ICD-10-CM | POA: Diagnosis not present

## 2023-07-01 DIAGNOSIS — I501 Left ventricular failure: Secondary | ICD-10-CM | POA: Diagnosis not present

## 2023-07-01 DIAGNOSIS — R2681 Unsteadiness on feet: Secondary | ICD-10-CM | POA: Diagnosis not present

## 2023-07-01 DIAGNOSIS — I4891 Unspecified atrial fibrillation: Secondary | ICD-10-CM | POA: Diagnosis not present

## 2023-07-01 DIAGNOSIS — M6281 Muscle weakness (generalized): Secondary | ICD-10-CM | POA: Diagnosis not present

## 2023-07-01 DIAGNOSIS — R944 Abnormal results of kidney function studies: Secondary | ICD-10-CM | POA: Diagnosis not present

## 2023-07-02 DIAGNOSIS — F03918 Unspecified dementia, unspecified severity, with other behavioral disturbance: Secondary | ICD-10-CM | POA: Diagnosis not present

## 2023-07-02 DIAGNOSIS — F32A Depression, unspecified: Secondary | ICD-10-CM | POA: Diagnosis not present

## 2023-07-02 DIAGNOSIS — R2681 Unsteadiness on feet: Secondary | ICD-10-CM | POA: Diagnosis not present

## 2023-07-02 DIAGNOSIS — E8770 Fluid overload, unspecified: Secondary | ICD-10-CM | POA: Diagnosis not present

## 2023-07-02 DIAGNOSIS — R41841 Cognitive communication deficit: Secondary | ICD-10-CM | POA: Diagnosis not present

## 2023-07-02 DIAGNOSIS — I4891 Unspecified atrial fibrillation: Secondary | ICD-10-CM | POA: Diagnosis not present

## 2023-07-02 DIAGNOSIS — M6281 Muscle weakness (generalized): Secondary | ICD-10-CM | POA: Diagnosis not present

## 2023-07-02 DIAGNOSIS — E876 Hypokalemia: Secondary | ICD-10-CM | POA: Diagnosis not present

## 2023-07-03 DIAGNOSIS — R2681 Unsteadiness on feet: Secondary | ICD-10-CM | POA: Diagnosis not present

## 2023-07-03 DIAGNOSIS — R41841 Cognitive communication deficit: Secondary | ICD-10-CM | POA: Diagnosis not present

## 2023-07-03 DIAGNOSIS — M6281 Muscle weakness (generalized): Secondary | ICD-10-CM | POA: Diagnosis not present

## 2023-07-03 DIAGNOSIS — I4891 Unspecified atrial fibrillation: Secondary | ICD-10-CM | POA: Diagnosis not present

## 2023-07-06 DIAGNOSIS — M6281 Muscle weakness (generalized): Secondary | ICD-10-CM | POA: Diagnosis not present

## 2023-07-06 DIAGNOSIS — R2681 Unsteadiness on feet: Secondary | ICD-10-CM | POA: Diagnosis not present

## 2023-07-06 DIAGNOSIS — R41841 Cognitive communication deficit: Secondary | ICD-10-CM | POA: Diagnosis not present

## 2023-07-06 DIAGNOSIS — I4891 Unspecified atrial fibrillation: Secondary | ICD-10-CM | POA: Diagnosis not present

## 2023-07-07 DIAGNOSIS — I509 Heart failure, unspecified: Secondary | ICD-10-CM | POA: Diagnosis not present

## 2023-07-07 DIAGNOSIS — I4891 Unspecified atrial fibrillation: Secondary | ICD-10-CM | POA: Diagnosis not present

## 2023-07-07 DIAGNOSIS — R2681 Unsteadiness on feet: Secondary | ICD-10-CM | POA: Diagnosis not present

## 2023-07-07 DIAGNOSIS — E8770 Fluid overload, unspecified: Secondary | ICD-10-CM | POA: Diagnosis not present

## 2023-07-07 DIAGNOSIS — M6281 Muscle weakness (generalized): Secondary | ICD-10-CM | POA: Diagnosis not present

## 2023-07-07 DIAGNOSIS — R41841 Cognitive communication deficit: Secondary | ICD-10-CM | POA: Diagnosis not present

## 2023-07-08 DIAGNOSIS — I509 Heart failure, unspecified: Secondary | ICD-10-CM | POA: Diagnosis not present

## 2023-07-08 DIAGNOSIS — R7989 Other specified abnormal findings of blood chemistry: Secondary | ICD-10-CM | POA: Diagnosis not present

## 2023-07-08 DIAGNOSIS — I501 Left ventricular failure: Secondary | ICD-10-CM | POA: Diagnosis not present

## 2023-07-08 DIAGNOSIS — R41841 Cognitive communication deficit: Secondary | ICD-10-CM | POA: Diagnosis not present

## 2023-07-08 DIAGNOSIS — R2681 Unsteadiness on feet: Secondary | ICD-10-CM | POA: Diagnosis not present

## 2023-07-08 DIAGNOSIS — M6281 Muscle weakness (generalized): Secondary | ICD-10-CM | POA: Diagnosis not present

## 2023-07-08 DIAGNOSIS — I4891 Unspecified atrial fibrillation: Secondary | ICD-10-CM | POA: Diagnosis not present

## 2023-07-08 DIAGNOSIS — E8770 Fluid overload, unspecified: Secondary | ICD-10-CM | POA: Diagnosis not present

## 2023-07-08 DIAGNOSIS — N179 Acute kidney failure, unspecified: Secondary | ICD-10-CM | POA: Diagnosis not present

## 2023-07-09 DIAGNOSIS — M6281 Muscle weakness (generalized): Secondary | ICD-10-CM | POA: Diagnosis not present

## 2023-07-09 DIAGNOSIS — E8770 Fluid overload, unspecified: Secondary | ICD-10-CM | POA: Diagnosis not present

## 2023-07-09 DIAGNOSIS — R2681 Unsteadiness on feet: Secondary | ICD-10-CM | POA: Diagnosis not present

## 2023-07-09 DIAGNOSIS — R41841 Cognitive communication deficit: Secondary | ICD-10-CM | POA: Diagnosis not present

## 2023-07-09 DIAGNOSIS — I4891 Unspecified atrial fibrillation: Secondary | ICD-10-CM | POA: Diagnosis not present

## 2023-07-10 DIAGNOSIS — E785 Hyperlipidemia, unspecified: Secondary | ICD-10-CM | POA: Diagnosis not present

## 2023-07-10 DIAGNOSIS — I10 Essential (primary) hypertension: Secondary | ICD-10-CM | POA: Diagnosis not present

## 2023-07-10 DIAGNOSIS — R2681 Unsteadiness on feet: Secondary | ICD-10-CM | POA: Diagnosis not present

## 2023-07-10 DIAGNOSIS — E09311 Drug or chemical induced diabetes mellitus with unspecified diabetic retinopathy with macular edema: Secondary | ICD-10-CM | POA: Diagnosis not present

## 2023-07-10 DIAGNOSIS — E119 Type 2 diabetes mellitus without complications: Secondary | ICD-10-CM | POA: Diagnosis not present

## 2023-07-10 DIAGNOSIS — R41841 Cognitive communication deficit: Secondary | ICD-10-CM | POA: Diagnosis not present

## 2023-07-10 DIAGNOSIS — I4891 Unspecified atrial fibrillation: Secondary | ICD-10-CM | POA: Diagnosis not present

## 2023-07-10 DIAGNOSIS — M6281 Muscle weakness (generalized): Secondary | ICD-10-CM | POA: Diagnosis not present

## 2023-07-13 DIAGNOSIS — R41841 Cognitive communication deficit: Secondary | ICD-10-CM | POA: Diagnosis not present

## 2023-07-13 DIAGNOSIS — I4891 Unspecified atrial fibrillation: Secondary | ICD-10-CM | POA: Diagnosis not present

## 2023-07-13 DIAGNOSIS — R2681 Unsteadiness on feet: Secondary | ICD-10-CM | POA: Diagnosis not present

## 2023-07-13 DIAGNOSIS — M6281 Muscle weakness (generalized): Secondary | ICD-10-CM | POA: Diagnosis not present

## 2023-07-14 DIAGNOSIS — R2681 Unsteadiness on feet: Secondary | ICD-10-CM | POA: Diagnosis not present

## 2023-07-14 DIAGNOSIS — I4891 Unspecified atrial fibrillation: Secondary | ICD-10-CM | POA: Diagnosis not present

## 2023-07-14 DIAGNOSIS — R41841 Cognitive communication deficit: Secondary | ICD-10-CM | POA: Diagnosis not present

## 2023-07-14 DIAGNOSIS — M6281 Muscle weakness (generalized): Secondary | ICD-10-CM | POA: Diagnosis not present

## 2023-07-15 DIAGNOSIS — I5022 Chronic systolic (congestive) heart failure: Secondary | ICD-10-CM | POA: Diagnosis not present

## 2023-07-16 DIAGNOSIS — E785 Hyperlipidemia, unspecified: Secondary | ICD-10-CM | POA: Diagnosis not present

## 2023-07-16 DIAGNOSIS — F3289 Other specified depressive episodes: Secondary | ICD-10-CM | POA: Diagnosis not present

## 2023-07-16 DIAGNOSIS — I10 Essential (primary) hypertension: Secondary | ICD-10-CM | POA: Diagnosis not present

## 2023-07-16 DIAGNOSIS — F32A Depression, unspecified: Secondary | ICD-10-CM | POA: Diagnosis not present

## 2023-07-16 DIAGNOSIS — I4891 Unspecified atrial fibrillation: Secondary | ICD-10-CM | POA: Diagnosis not present

## 2023-07-16 DIAGNOSIS — M6281 Muscle weakness (generalized): Secondary | ICD-10-CM | POA: Diagnosis not present

## 2023-07-16 DIAGNOSIS — F419 Anxiety disorder, unspecified: Secondary | ICD-10-CM | POA: Diagnosis not present

## 2023-07-16 DIAGNOSIS — R2681 Unsteadiness on feet: Secondary | ICD-10-CM | POA: Diagnosis not present

## 2023-07-16 DIAGNOSIS — F03918 Unspecified dementia, unspecified severity, with other behavioral disturbance: Secondary | ICD-10-CM | POA: Diagnosis not present

## 2023-07-16 DIAGNOSIS — G3184 Mild cognitive impairment, so stated: Secondary | ICD-10-CM | POA: Diagnosis not present

## 2023-07-16 DIAGNOSIS — R41841 Cognitive communication deficit: Secondary | ICD-10-CM | POA: Diagnosis not present

## 2023-07-17 DIAGNOSIS — I4891 Unspecified atrial fibrillation: Secondary | ICD-10-CM | POA: Diagnosis not present

## 2023-07-17 DIAGNOSIS — R41841 Cognitive communication deficit: Secondary | ICD-10-CM | POA: Diagnosis not present

## 2023-07-17 DIAGNOSIS — R2681 Unsteadiness on feet: Secondary | ICD-10-CM | POA: Diagnosis not present

## 2023-07-17 DIAGNOSIS — M6281 Muscle weakness (generalized): Secondary | ICD-10-CM | POA: Diagnosis not present

## 2023-07-21 DIAGNOSIS — R2681 Unsteadiness on feet: Secondary | ICD-10-CM | POA: Diagnosis not present

## 2023-07-21 DIAGNOSIS — R41841 Cognitive communication deficit: Secondary | ICD-10-CM | POA: Diagnosis not present

## 2023-07-21 DIAGNOSIS — M6281 Muscle weakness (generalized): Secondary | ICD-10-CM | POA: Diagnosis not present

## 2023-07-21 DIAGNOSIS — I4891 Unspecified atrial fibrillation: Secondary | ICD-10-CM | POA: Diagnosis not present

## 2023-07-22 DIAGNOSIS — E8770 Fluid overload, unspecified: Secondary | ICD-10-CM | POA: Diagnosis not present

## 2023-07-22 DIAGNOSIS — E876 Hypokalemia: Secondary | ICD-10-CM | POA: Diagnosis not present

## 2023-07-22 DIAGNOSIS — R7989 Other specified abnormal findings of blood chemistry: Secondary | ICD-10-CM | POA: Diagnosis not present

## 2023-07-22 DIAGNOSIS — N179 Acute kidney failure, unspecified: Secondary | ICD-10-CM | POA: Diagnosis not present

## 2023-07-23 DIAGNOSIS — M6281 Muscle weakness (generalized): Secondary | ICD-10-CM | POA: Diagnosis not present

## 2023-07-23 DIAGNOSIS — R41841 Cognitive communication deficit: Secondary | ICD-10-CM | POA: Diagnosis not present

## 2023-07-23 DIAGNOSIS — I10 Essential (primary) hypertension: Secondary | ICD-10-CM | POA: Diagnosis not present

## 2023-07-23 DIAGNOSIS — I4891 Unspecified atrial fibrillation: Secondary | ICD-10-CM | POA: Diagnosis not present

## 2023-07-23 DIAGNOSIS — R2681 Unsteadiness on feet: Secondary | ICD-10-CM | POA: Diagnosis not present

## 2023-07-23 DIAGNOSIS — E876 Hypokalemia: Secondary | ICD-10-CM | POA: Diagnosis not present

## 2023-07-24 DIAGNOSIS — I509 Heart failure, unspecified: Secondary | ICD-10-CM | POA: Diagnosis not present

## 2023-07-24 DIAGNOSIS — R2681 Unsteadiness on feet: Secondary | ICD-10-CM | POA: Diagnosis not present

## 2023-07-24 DIAGNOSIS — I4891 Unspecified atrial fibrillation: Secondary | ICD-10-CM | POA: Diagnosis not present

## 2023-07-24 DIAGNOSIS — M6281 Muscle weakness (generalized): Secondary | ICD-10-CM | POA: Diagnosis not present

## 2023-07-24 DIAGNOSIS — R41841 Cognitive communication deficit: Secondary | ICD-10-CM | POA: Diagnosis not present

## 2023-07-27 DIAGNOSIS — R2681 Unsteadiness on feet: Secondary | ICD-10-CM | POA: Diagnosis not present

## 2023-07-27 DIAGNOSIS — R41841 Cognitive communication deficit: Secondary | ICD-10-CM | POA: Diagnosis not present

## 2023-07-27 DIAGNOSIS — M6281 Muscle weakness (generalized): Secondary | ICD-10-CM | POA: Diagnosis not present

## 2023-07-27 DIAGNOSIS — I4891 Unspecified atrial fibrillation: Secondary | ICD-10-CM | POA: Diagnosis not present

## 2023-07-27 DIAGNOSIS — I509 Heart failure, unspecified: Secondary | ICD-10-CM | POA: Diagnosis not present

## 2023-07-27 DIAGNOSIS — E8770 Fluid overload, unspecified: Secondary | ICD-10-CM | POA: Diagnosis not present

## 2023-07-28 DIAGNOSIS — R2681 Unsteadiness on feet: Secondary | ICD-10-CM | POA: Diagnosis not present

## 2023-07-28 DIAGNOSIS — R41841 Cognitive communication deficit: Secondary | ICD-10-CM | POA: Diagnosis not present

## 2023-07-28 DIAGNOSIS — I4891 Unspecified atrial fibrillation: Secondary | ICD-10-CM | POA: Diagnosis not present

## 2023-07-28 DIAGNOSIS — M6281 Muscle weakness (generalized): Secondary | ICD-10-CM | POA: Diagnosis not present

## 2023-07-29 DIAGNOSIS — R41841 Cognitive communication deficit: Secondary | ICD-10-CM | POA: Diagnosis not present

## 2023-07-29 DIAGNOSIS — E8770 Fluid overload, unspecified: Secondary | ICD-10-CM | POA: Diagnosis not present

## 2023-07-29 DIAGNOSIS — R2681 Unsteadiness on feet: Secondary | ICD-10-CM | POA: Diagnosis not present

## 2023-07-29 DIAGNOSIS — M6281 Muscle weakness (generalized): Secondary | ICD-10-CM | POA: Diagnosis not present

## 2023-07-29 DIAGNOSIS — E876 Hypokalemia: Secondary | ICD-10-CM | POA: Diagnosis not present

## 2023-07-29 DIAGNOSIS — I4891 Unspecified atrial fibrillation: Secondary | ICD-10-CM | POA: Diagnosis not present

## 2023-07-31 DIAGNOSIS — I509 Heart failure, unspecified: Secondary | ICD-10-CM | POA: Diagnosis not present

## 2023-07-31 DIAGNOSIS — D649 Anemia, unspecified: Secondary | ICD-10-CM | POA: Diagnosis not present

## 2023-07-31 DIAGNOSIS — Z09 Encounter for follow-up examination after completed treatment for conditions other than malignant neoplasm: Secondary | ICD-10-CM | POA: Diagnosis not present

## 2023-08-05 ENCOUNTER — Ambulatory Visit: Payer: Medicare HMO | Attending: Cardiology | Admitting: Cardiology

## 2023-08-05 ENCOUNTER — Encounter: Payer: Self-pay | Admitting: Cardiology

## 2023-08-05 VITALS — BP 136/74 | HR 98 | Ht 63.0 in | Wt 139.2 lb

## 2023-08-05 DIAGNOSIS — I4821 Permanent atrial fibrillation: Secondary | ICD-10-CM | POA: Diagnosis not present

## 2023-08-05 DIAGNOSIS — E782 Mixed hyperlipidemia: Secondary | ICD-10-CM

## 2023-08-05 DIAGNOSIS — R41841 Cognitive communication deficit: Secondary | ICD-10-CM | POA: Diagnosis not present

## 2023-08-05 DIAGNOSIS — I10 Essential (primary) hypertension: Secondary | ICD-10-CM | POA: Diagnosis not present

## 2023-08-05 DIAGNOSIS — I4891 Unspecified atrial fibrillation: Secondary | ICD-10-CM | POA: Diagnosis not present

## 2023-08-05 DIAGNOSIS — Z45010 Encounter for checking and testing of cardiac pacemaker pulse generator [battery]: Secondary | ICD-10-CM | POA: Diagnosis not present

## 2023-08-05 DIAGNOSIS — Z950 Presence of cardiac pacemaker: Secondary | ICD-10-CM

## 2023-08-05 NOTE — Patient Instructions (Signed)

## 2023-08-05 NOTE — Progress Notes (Signed)
Cardiology Office Note:    Date:  08/05/2023   ID:  Brock Ra, DOB 1926-03-05, MRN [id number]  PCP:  Patient, No Pcp Per  Cardiologist:  Garwin Brothers, MD   Referring MD: Hurshel Party, NP    ASSESSMENT:    1. Benign essential hypertension   2. Permanent atrial fibrillation (HCC)   3. Mixed hyperlipidemia   4. Pacemaker   5. Pacemaker battery depletion    PLAN:    In order of problems listed above:  Primary prevention stressed with the patient.  Importance of compliance with diet medication stressed and patient verbalized standing. Essential hypertension: Blood pressure stable and diet was emphasized. Atrial fibrillation:I discussed with the patient atrial fibrillation, disease process. Management and therapy including rate and rhythm control, anticoagulation benefits and potential risks were discussed extensively with the patient. Patient had multiple questions which were answered to patient's satisfaction.  Patient has not had any falls.  She is stable to the best of her ability and has enough help at the nursing home.  I mentioned to the daughter that if his gait is unstable then anticoagulation could be discontinued in her best interest.  They understand. Mixed dyslipidemia: On lipid-lowering medications followed by primary care. Patient will be seen in follow-up appointment in 9 months or earlier if the patient has any concerns.    Medication Adjustments/Labs and Tests Ordered: Current medicines are reviewed at length with the patient today.  Concerns regarding medicines are outlined above.  No orders of the defined types were placed in this encounter.  No orders of the defined types were placed in this encounter.    No chief complaint on file.    History of Present Illness:    Dalila Kesner is a 89 y.o. female.  Patient has past medical history of atrial fibrillation, essential hypertension and a depleted pacemaker.  She denies any problems at this time.  She  is accompanied by her daughter.  Patient has significant dementia.  No chest pain orthopnea or PND.  At the time of my evaluation, the patient is alert awake oriented and in no distress.  She is brought in in a wheelchair.  Past Medical History:  Diagnosis Date   Acute blood loss anemia 08/04/2015   Acute GI bleeding 08/04/2015   Arrhythmia    Atrial fibrillation (HCC)    Benign essential hypertension 10/13/2018   Breast cancer (HCC)    Cancer (HCC)    CHF (congestive heart failure) (HCC)    Diabetes mellitus without complication (HCC)    Diverticulitis    Diverticulosis 08/04/2015   GERD (gastroesophageal reflux disease)    Gestational diabetes, diet controlled    History of pacemaker 08/04/2015   HLD (hyperlipidemia) 08/04/2015   HTN (hypertension) 08/04/2015   Hyperlipidemia    Hypertension    Hypothyroidism    Iron deficiency anemia 08/04/2015   Pacemaker 08/27/2017   Pacemaker battery depletion 08/27/2017   Permanent atrial fibrillation (HCC) 03/04/2021   Rectal prolapse 12/04/2022   SSS (sick sinus syndrome) (HCC) 08/14/2016   Thyroid disease     Past Surgical History:  Procedure Laterality Date   COLONOSCOPY  2004   ESOPHAGOGASTRODUODENOSCOPY N/A 08/04/2015   Procedure: ESOPHAGOGASTRODUODENOSCOPY (EGD);  Surgeon: Vida Rigger, MD;  Location: [facility] ENDOSCOPY;  Service: Endoscopy;  Laterality: N/A;   INSERT / REPLACE / REMOVE PACEMAKER      Current Medications: Current Meds  Medication Sig   acetaminophen (TYLENOL) 650 MG CR tablet Take 650 mg by mouth 3 (  three) times daily.   atorvastatin (LIPITOR) 20 MG tablet Take 20 mg by mouth daily.   bethanechol (URECHOLINE) 25 MG tablet Take 50 mg by mouth 2 (two) times daily.   ELIQUIS 2.5 MG TABS tablet Take 2.5 mg by mouth 2 (two) times daily.   levothyroxine (SYNTHROID, LEVOTHROID) 100 MCG tablet Take 100 mcg by mouth daily.   metoprolol tartrate (LOPRESSOR) 25 MG tablet Take 12.5 mg by mouth 2 (two) times daily.    nitrofurantoin (MACRODANTIN) 50 MG capsule Take 50 mg by mouth daily.   potassium chloride 20 MEQ/15ML (10%) SOLN Take 40 mEq by mouth 2 (two) times daily.   QUEtiapine (SEROQUEL) 50 MG tablet Take 50 mg by mouth daily.    sertraline (ZOLOFT) 50 MG tablet Take 50 mg by mouth daily.   tamsulosin (FLOMAX) 0.4 MG CAPS capsule Take 0.4 mg by mouth daily.   TIADYLT ER 120 MG 24 hr capsule Take 120 mg by mouth daily.     Allergies:   Tramadol   Social History   Socioeconomic History   Marital status: Married    Spouse name: Not on file   Number of children: Not on file   Years of education: Not on file   Highest education level: Not on file  Occupational History   Not on file  Tobacco Use   Smoking status: Never   Smokeless tobacco: Never  Substance and Sexual Activity   Alcohol use: No   Drug use: No   Sexual activity: Not on file  Other Topics Concern   Not on file  Social History Narrative   Not on file   Social Determinants of Health   Financial Resource Strain: Not on file  Food Insecurity: Not on file  Transportation Needs: Not on file  Physical Activity: Not on file  Stress: Not on file  Social Connections: Not on file     Family History: The patient's family history includes AAA (abdominal aortic aneurysm) in her brother and mother; Heart disease in her father.  ROS:   Please see the history of present illness.    All other systems reviewed and are negative.  EKGs/Labs/Other Studies Reviewed:    The following studies were reviewed today: I discussed my findings with the patient at length   Recent Labs: No results found for requested labs within last 365 days.  Recent Lipid Panel No results found for: "CHOL", "TRIG", "HDL", "CHOLHDL", "VLDL", "LDLCALC", "LDLDIRECT"  Physical Exam:    VS:  BP 136/74   Pulse 98   Ht 5\' 3"  (1.6 m)   Wt 139 lb 3.2 oz (63.1 kg)   LMP  (LMP Unknown)   SpO2 94%   BMI 24.66 kg/m     Wt Readings from Last 3 Encounters:   08/05/23 139 lb 3.2 oz (63.1 kg)  05/05/22 134 lb 3.2 oz (60.9 kg)  09/11/21 140 lb 6.4 oz (63.7 kg)     GEN: Patient is in no acute distress HEENT: Normal NECK: No JVD; No carotid bruits LYMPHATICS: No lymphadenopathy CARDIAC: Hear sounds irregular, 2/6 systolic murmur at the apex. RESPIRATORY:  Clear to auscultation without rales, wheezing or rhonchi  ABDOMEN: Soft, non-tender, non-distended MUSCULOSKELETAL:  No edema; No deformity  SKIN: Warm and dry NEUROLOGIC:  Alert and oriented x 3 PSYCHIATRIC:  Normal affect   Signed, Garwin Brothers, MD  08/05/2023 9:29 AM    [facility]

## 2023-08-06 DIAGNOSIS — I10 Essential (primary) hypertension: Secondary | ICD-10-CM | POA: Diagnosis not present

## 2023-08-06 DIAGNOSIS — I509 Heart failure, unspecified: Secondary | ICD-10-CM | POA: Diagnosis not present

## 2023-08-06 DIAGNOSIS — R41841 Cognitive communication deficit: Secondary | ICD-10-CM | POA: Diagnosis not present

## 2023-08-06 DIAGNOSIS — E8770 Fluid overload, unspecified: Secondary | ICD-10-CM | POA: Diagnosis not present

## 2023-08-06 DIAGNOSIS — E559 Vitamin D deficiency, unspecified: Secondary | ICD-10-CM | POA: Diagnosis not present

## 2023-08-06 DIAGNOSIS — Z5181 Encounter for therapeutic drug level monitoring: Secondary | ICD-10-CM | POA: Diagnosis not present

## 2023-08-06 DIAGNOSIS — I4891 Unspecified atrial fibrillation: Secondary | ICD-10-CM | POA: Diagnosis not present

## 2023-08-06 DIAGNOSIS — N179 Acute kidney failure, unspecified: Secondary | ICD-10-CM | POA: Diagnosis not present

## 2023-08-10 DIAGNOSIS — R41841 Cognitive communication deficit: Secondary | ICD-10-CM | POA: Diagnosis not present

## 2023-08-10 DIAGNOSIS — I4891 Unspecified atrial fibrillation: Secondary | ICD-10-CM | POA: Diagnosis not present

## 2023-08-11 DIAGNOSIS — I4891 Unspecified atrial fibrillation: Secondary | ICD-10-CM | POA: Diagnosis not present

## 2023-08-11 DIAGNOSIS — R41841 Cognitive communication deficit: Secondary | ICD-10-CM | POA: Diagnosis not present

## 2023-08-11 DIAGNOSIS — F411 Generalized anxiety disorder: Secondary | ICD-10-CM | POA: Diagnosis not present

## 2023-08-11 DIAGNOSIS — F01B2 Vascular dementia, moderate, with psychotic disturbance: Secondary | ICD-10-CM | POA: Diagnosis not present

## 2023-08-11 DIAGNOSIS — F5105 Insomnia due to other mental disorder: Secondary | ICD-10-CM | POA: Diagnosis not present

## 2023-08-11 DIAGNOSIS — I10 Essential (primary) hypertension: Secondary | ICD-10-CM | POA: Diagnosis not present

## 2023-08-12 DIAGNOSIS — H1131 Conjunctival hemorrhage, right eye: Secondary | ICD-10-CM | POA: Diagnosis not present

## 2023-08-12 DIAGNOSIS — I4891 Unspecified atrial fibrillation: Secondary | ICD-10-CM | POA: Diagnosis not present

## 2023-08-12 DIAGNOSIS — R41841 Cognitive communication deficit: Secondary | ICD-10-CM | POA: Diagnosis not present

## 2023-08-13 DIAGNOSIS — N179 Acute kidney failure, unspecified: Secondary | ICD-10-CM | POA: Diagnosis not present

## 2023-08-13 DIAGNOSIS — E8770 Fluid overload, unspecified: Secondary | ICD-10-CM | POA: Diagnosis not present

## 2023-08-13 DIAGNOSIS — I509 Heart failure, unspecified: Secondary | ICD-10-CM | POA: Diagnosis not present

## 2023-08-13 DIAGNOSIS — I517 Cardiomegaly: Secondary | ICD-10-CM | POA: Diagnosis not present

## 2023-08-13 DIAGNOSIS — D649 Anemia, unspecified: Secondary | ICD-10-CM | POA: Diagnosis not present

## 2023-08-17 DIAGNOSIS — I509 Heart failure, unspecified: Secondary | ICD-10-CM | POA: Diagnosis not present

## 2023-08-17 DIAGNOSIS — R41841 Cognitive communication deficit: Secondary | ICD-10-CM | POA: Diagnosis not present

## 2023-08-17 DIAGNOSIS — I4891 Unspecified atrial fibrillation: Secondary | ICD-10-CM | POA: Diagnosis not present

## 2023-08-17 DIAGNOSIS — E785 Hyperlipidemia, unspecified: Secondary | ICD-10-CM | POA: Diagnosis not present

## 2023-08-17 DIAGNOSIS — I10 Essential (primary) hypertension: Secondary | ICD-10-CM | POA: Diagnosis not present

## 2023-08-17 DIAGNOSIS — F3289 Other specified depressive episodes: Secondary | ICD-10-CM | POA: Diagnosis not present

## 2023-08-17 DIAGNOSIS — F32A Depression, unspecified: Secondary | ICD-10-CM | POA: Diagnosis not present

## 2023-08-18 DIAGNOSIS — R41841 Cognitive communication deficit: Secondary | ICD-10-CM | POA: Diagnosis not present

## 2023-08-18 DIAGNOSIS — I4891 Unspecified atrial fibrillation: Secondary | ICD-10-CM | POA: Diagnosis not present

## 2023-08-18 DIAGNOSIS — H6123 Impacted cerumen, bilateral: Secondary | ICD-10-CM | POA: Diagnosis not present

## 2023-08-19 DIAGNOSIS — Z5181 Encounter for therapeutic drug level monitoring: Secondary | ICD-10-CM | POA: Diagnosis not present

## 2023-08-19 DIAGNOSIS — I4891 Unspecified atrial fibrillation: Secondary | ICD-10-CM | POA: Diagnosis not present

## 2023-08-19 DIAGNOSIS — Z741 Need for assistance with personal care: Secondary | ICD-10-CM | POA: Diagnosis not present

## 2023-08-19 DIAGNOSIS — F03918 Unspecified dementia, unspecified severity, with other behavioral disturbance: Secondary | ICD-10-CM | POA: Diagnosis not present

## 2023-08-19 DIAGNOSIS — M6281 Muscle weakness (generalized): Secondary | ICD-10-CM | POA: Diagnosis not present

## 2023-08-19 DIAGNOSIS — F419 Anxiety disorder, unspecified: Secondary | ICD-10-CM | POA: Diagnosis not present

## 2023-08-19 DIAGNOSIS — F32A Depression, unspecified: Secondary | ICD-10-CM | POA: Diagnosis not present

## 2023-08-20 DIAGNOSIS — I4891 Unspecified atrial fibrillation: Secondary | ICD-10-CM | POA: Diagnosis not present

## 2023-08-20 DIAGNOSIS — R41841 Cognitive communication deficit: Secondary | ICD-10-CM | POA: Diagnosis not present

## 2023-08-20 DIAGNOSIS — M6281 Muscle weakness (generalized): Secondary | ICD-10-CM | POA: Diagnosis not present

## 2023-08-20 DIAGNOSIS — Z741 Need for assistance with personal care: Secondary | ICD-10-CM | POA: Diagnosis not present

## 2023-08-21 DIAGNOSIS — M6281 Muscle weakness (generalized): Secondary | ICD-10-CM | POA: Diagnosis not present

## 2023-08-21 DIAGNOSIS — Z741 Need for assistance with personal care: Secondary | ICD-10-CM | POA: Diagnosis not present

## 2023-08-21 DIAGNOSIS — I4891 Unspecified atrial fibrillation: Secondary | ICD-10-CM | POA: Diagnosis not present

## 2023-08-24 DIAGNOSIS — I4891 Unspecified atrial fibrillation: Secondary | ICD-10-CM | POA: Diagnosis not present

## 2023-08-24 DIAGNOSIS — Z741 Need for assistance with personal care: Secondary | ICD-10-CM | POA: Diagnosis not present

## 2023-08-24 DIAGNOSIS — M6281 Muscle weakness (generalized): Secondary | ICD-10-CM | POA: Diagnosis not present

## 2023-08-25 DIAGNOSIS — I4891 Unspecified atrial fibrillation: Secondary | ICD-10-CM | POA: Diagnosis not present

## 2023-08-25 DIAGNOSIS — Z741 Need for assistance with personal care: Secondary | ICD-10-CM | POA: Diagnosis not present

## 2023-08-25 DIAGNOSIS — M6281 Muscle weakness (generalized): Secondary | ICD-10-CM | POA: Diagnosis not present

## 2023-08-25 DIAGNOSIS — R41841 Cognitive communication deficit: Secondary | ICD-10-CM | POA: Diagnosis not present

## 2023-08-26 DIAGNOSIS — Z741 Need for assistance with personal care: Secondary | ICD-10-CM | POA: Diagnosis not present

## 2023-08-26 DIAGNOSIS — R41841 Cognitive communication deficit: Secondary | ICD-10-CM | POA: Diagnosis not present

## 2023-08-26 DIAGNOSIS — M6281 Muscle weakness (generalized): Secondary | ICD-10-CM | POA: Diagnosis not present

## 2023-08-26 DIAGNOSIS — I4891 Unspecified atrial fibrillation: Secondary | ICD-10-CM | POA: Diagnosis not present

## 2023-08-27 DIAGNOSIS — Z741 Need for assistance with personal care: Secondary | ICD-10-CM | POA: Diagnosis not present

## 2023-08-27 DIAGNOSIS — I4891 Unspecified atrial fibrillation: Secondary | ICD-10-CM | POA: Diagnosis not present

## 2023-08-27 DIAGNOSIS — I509 Heart failure, unspecified: Secondary | ICD-10-CM | POA: Diagnosis not present

## 2023-08-27 DIAGNOSIS — M6281 Muscle weakness (generalized): Secondary | ICD-10-CM | POA: Diagnosis not present

## 2023-08-31 DIAGNOSIS — R41841 Cognitive communication deficit: Secondary | ICD-10-CM | POA: Diagnosis not present

## 2023-08-31 DIAGNOSIS — I4891 Unspecified atrial fibrillation: Secondary | ICD-10-CM | POA: Diagnosis not present

## 2023-08-31 DIAGNOSIS — Z741 Need for assistance with personal care: Secondary | ICD-10-CM | POA: Diagnosis not present

## 2023-08-31 DIAGNOSIS — I517 Cardiomegaly: Secondary | ICD-10-CM | POA: Diagnosis not present

## 2023-08-31 DIAGNOSIS — I509 Heart failure, unspecified: Secondary | ICD-10-CM | POA: Diagnosis not present

## 2023-08-31 DIAGNOSIS — M6281 Muscle weakness (generalized): Secondary | ICD-10-CM | POA: Diagnosis not present

## 2023-08-31 DIAGNOSIS — E8770 Fluid overload, unspecified: Secondary | ICD-10-CM | POA: Diagnosis not present

## 2023-09-01 DIAGNOSIS — R41841 Cognitive communication deficit: Secondary | ICD-10-CM | POA: Diagnosis not present

## 2023-09-01 DIAGNOSIS — Z741 Need for assistance with personal care: Secondary | ICD-10-CM | POA: Diagnosis not present

## 2023-09-01 DIAGNOSIS — I4891 Unspecified atrial fibrillation: Secondary | ICD-10-CM | POA: Diagnosis not present

## 2023-09-01 DIAGNOSIS — M6281 Muscle weakness (generalized): Secondary | ICD-10-CM | POA: Diagnosis not present

## 2023-09-02 DIAGNOSIS — M6281 Muscle weakness (generalized): Secondary | ICD-10-CM | POA: Diagnosis not present

## 2023-09-02 DIAGNOSIS — Z741 Need for assistance with personal care: Secondary | ICD-10-CM | POA: Diagnosis not present

## 2023-09-02 DIAGNOSIS — I4891 Unspecified atrial fibrillation: Secondary | ICD-10-CM | POA: Diagnosis not present

## 2023-09-03 DIAGNOSIS — M6281 Muscle weakness (generalized): Secondary | ICD-10-CM | POA: Diagnosis not present

## 2023-09-03 DIAGNOSIS — I4891 Unspecified atrial fibrillation: Secondary | ICD-10-CM | POA: Diagnosis not present

## 2023-09-03 DIAGNOSIS — R41841 Cognitive communication deficit: Secondary | ICD-10-CM | POA: Diagnosis not present

## 2023-09-03 DIAGNOSIS — Z741 Need for assistance with personal care: Secondary | ICD-10-CM | POA: Diagnosis not present

## 2023-09-04 DIAGNOSIS — I4891 Unspecified atrial fibrillation: Secondary | ICD-10-CM | POA: Diagnosis not present

## 2023-09-04 DIAGNOSIS — M6281 Muscle weakness (generalized): Secondary | ICD-10-CM | POA: Diagnosis not present

## 2023-09-04 DIAGNOSIS — Z741 Need for assistance with personal care: Secondary | ICD-10-CM | POA: Diagnosis not present

## 2023-09-04 DIAGNOSIS — R41841 Cognitive communication deficit: Secondary | ICD-10-CM | POA: Diagnosis not present

## 2023-09-07 DIAGNOSIS — Z741 Need for assistance with personal care: Secondary | ICD-10-CM | POA: Diagnosis not present

## 2023-09-07 DIAGNOSIS — R41841 Cognitive communication deficit: Secondary | ICD-10-CM | POA: Diagnosis not present

## 2023-09-07 DIAGNOSIS — I4891 Unspecified atrial fibrillation: Secondary | ICD-10-CM | POA: Diagnosis not present

## 2023-09-07 DIAGNOSIS — M6281 Muscle weakness (generalized): Secondary | ICD-10-CM | POA: Diagnosis not present

## 2023-09-08 DIAGNOSIS — R41841 Cognitive communication deficit: Secondary | ICD-10-CM | POA: Diagnosis not present

## 2023-09-08 DIAGNOSIS — I4891 Unspecified atrial fibrillation: Secondary | ICD-10-CM | POA: Diagnosis not present

## 2023-09-08 DIAGNOSIS — E1159 Type 2 diabetes mellitus with other circulatory complications: Secondary | ICD-10-CM | POA: Diagnosis not present

## 2023-09-08 DIAGNOSIS — B351 Tinea unguium: Secondary | ICD-10-CM | POA: Diagnosis not present

## 2023-09-09 DIAGNOSIS — Z741 Need for assistance with personal care: Secondary | ICD-10-CM | POA: Diagnosis not present

## 2023-09-09 DIAGNOSIS — M6281 Muscle weakness (generalized): Secondary | ICD-10-CM | POA: Diagnosis not present

## 2023-09-09 DIAGNOSIS — I4891 Unspecified atrial fibrillation: Secondary | ICD-10-CM | POA: Diagnosis not present

## 2023-09-10 DIAGNOSIS — M6281 Muscle weakness (generalized): Secondary | ICD-10-CM | POA: Diagnosis not present

## 2023-09-10 DIAGNOSIS — Z741 Need for assistance with personal care: Secondary | ICD-10-CM | POA: Diagnosis not present

## 2023-09-10 DIAGNOSIS — I4891 Unspecified atrial fibrillation: Secondary | ICD-10-CM | POA: Diagnosis not present

## 2023-09-11 DIAGNOSIS — I4891 Unspecified atrial fibrillation: Secondary | ICD-10-CM | POA: Diagnosis not present

## 2023-09-11 DIAGNOSIS — I10 Essential (primary) hypertension: Secondary | ICD-10-CM | POA: Diagnosis not present

## 2023-09-11 DIAGNOSIS — M6281 Muscle weakness (generalized): Secondary | ICD-10-CM | POA: Diagnosis not present

## 2023-09-11 DIAGNOSIS — I509 Heart failure, unspecified: Secondary | ICD-10-CM | POA: Diagnosis not present

## 2023-09-11 DIAGNOSIS — R41841 Cognitive communication deficit: Secondary | ICD-10-CM | POA: Diagnosis not present

## 2023-09-11 DIAGNOSIS — Z741 Need for assistance with personal care: Secondary | ICD-10-CM | POA: Diagnosis not present

## 2023-09-11 DIAGNOSIS — Z23 Encounter for immunization: Secondary | ICD-10-CM | POA: Diagnosis not present

## 2023-09-11 DIAGNOSIS — E43 Unspecified severe protein-calorie malnutrition: Secondary | ICD-10-CM | POA: Diagnosis not present

## 2023-09-14 DIAGNOSIS — F419 Anxiety disorder, unspecified: Secondary | ICD-10-CM | POA: Diagnosis not present

## 2023-09-14 DIAGNOSIS — I4891 Unspecified atrial fibrillation: Secondary | ICD-10-CM | POA: Diagnosis not present

## 2023-09-14 DIAGNOSIS — B356 Tinea cruris: Secondary | ICD-10-CM | POA: Diagnosis not present

## 2023-09-14 DIAGNOSIS — Z741 Need for assistance with personal care: Secondary | ICD-10-CM | POA: Diagnosis not present

## 2023-09-14 DIAGNOSIS — F32A Depression, unspecified: Secondary | ICD-10-CM | POA: Diagnosis not present

## 2023-09-14 DIAGNOSIS — M6281 Muscle weakness (generalized): Secondary | ICD-10-CM | POA: Diagnosis not present

## 2023-09-15 DIAGNOSIS — I4891 Unspecified atrial fibrillation: Secondary | ICD-10-CM | POA: Diagnosis not present

## 2023-09-15 DIAGNOSIS — Z741 Need for assistance with personal care: Secondary | ICD-10-CM | POA: Diagnosis not present

## 2023-09-15 DIAGNOSIS — R41841 Cognitive communication deficit: Secondary | ICD-10-CM | POA: Diagnosis not present

## 2023-09-15 DIAGNOSIS — M6281 Muscle weakness (generalized): Secondary | ICD-10-CM | POA: Diagnosis not present

## 2023-09-16 DIAGNOSIS — R41841 Cognitive communication deficit: Secondary | ICD-10-CM | POA: Diagnosis not present

## 2023-09-16 DIAGNOSIS — M6281 Muscle weakness (generalized): Secondary | ICD-10-CM | POA: Diagnosis not present

## 2023-09-16 DIAGNOSIS — I4891 Unspecified atrial fibrillation: Secondary | ICD-10-CM | POA: Diagnosis not present

## 2023-09-16 DIAGNOSIS — Z741 Need for assistance with personal care: Secondary | ICD-10-CM | POA: Diagnosis not present

## 2023-09-17 DIAGNOSIS — R41841 Cognitive communication deficit: Secondary | ICD-10-CM | POA: Diagnosis not present

## 2023-09-17 DIAGNOSIS — Z741 Need for assistance with personal care: Secondary | ICD-10-CM | POA: Diagnosis not present

## 2023-09-17 DIAGNOSIS — I4891 Unspecified atrial fibrillation: Secondary | ICD-10-CM | POA: Diagnosis not present

## 2023-09-17 DIAGNOSIS — M6281 Muscle weakness (generalized): Secondary | ICD-10-CM | POA: Diagnosis not present

## 2023-09-18 DIAGNOSIS — R41841 Cognitive communication deficit: Secondary | ICD-10-CM | POA: Diagnosis not present

## 2023-09-18 DIAGNOSIS — I4891 Unspecified atrial fibrillation: Secondary | ICD-10-CM | POA: Diagnosis not present

## 2023-09-18 DIAGNOSIS — Z741 Need for assistance with personal care: Secondary | ICD-10-CM | POA: Diagnosis not present

## 2023-09-18 DIAGNOSIS — M6281 Muscle weakness (generalized): Secondary | ICD-10-CM | POA: Diagnosis not present

## 2023-09-21 DIAGNOSIS — M6281 Muscle weakness (generalized): Secondary | ICD-10-CM | POA: Diagnosis not present

## 2023-09-21 DIAGNOSIS — Z741 Need for assistance with personal care: Secondary | ICD-10-CM | POA: Diagnosis not present

## 2023-09-21 DIAGNOSIS — I4891 Unspecified atrial fibrillation: Secondary | ICD-10-CM | POA: Diagnosis not present

## 2023-09-21 DIAGNOSIS — R41841 Cognitive communication deficit: Secondary | ICD-10-CM | POA: Diagnosis not present

## 2023-09-22 DIAGNOSIS — M6281 Muscle weakness (generalized): Secondary | ICD-10-CM | POA: Diagnosis not present

## 2023-09-22 DIAGNOSIS — R4181 Age-related cognitive decline: Secondary | ICD-10-CM | POA: Diagnosis not present

## 2023-09-22 DIAGNOSIS — F32A Depression, unspecified: Secondary | ICD-10-CM | POA: Diagnosis not present

## 2023-09-22 DIAGNOSIS — Z741 Need for assistance with personal care: Secondary | ICD-10-CM | POA: Diagnosis not present

## 2023-09-22 DIAGNOSIS — I4891 Unspecified atrial fibrillation: Secondary | ICD-10-CM | POA: Diagnosis not present

## 2023-09-22 DIAGNOSIS — F419 Anxiety disorder, unspecified: Secondary | ICD-10-CM | POA: Diagnosis not present

## 2023-09-22 DIAGNOSIS — F03918 Unspecified dementia, unspecified severity, with other behavioral disturbance: Secondary | ICD-10-CM | POA: Diagnosis not present

## 2023-09-23 DIAGNOSIS — Z741 Need for assistance with personal care: Secondary | ICD-10-CM | POA: Diagnosis not present

## 2023-09-23 DIAGNOSIS — M6281 Muscle weakness (generalized): Secondary | ICD-10-CM | POA: Diagnosis not present

## 2023-09-23 DIAGNOSIS — I4891 Unspecified atrial fibrillation: Secondary | ICD-10-CM | POA: Diagnosis not present

## 2023-09-24 DIAGNOSIS — R41841 Cognitive communication deficit: Secondary | ICD-10-CM | POA: Diagnosis not present

## 2023-09-24 DIAGNOSIS — M6281 Muscle weakness (generalized): Secondary | ICD-10-CM | POA: Diagnosis not present

## 2023-09-24 DIAGNOSIS — Z741 Need for assistance with personal care: Secondary | ICD-10-CM | POA: Diagnosis not present

## 2023-09-24 DIAGNOSIS — I4891 Unspecified atrial fibrillation: Secondary | ICD-10-CM | POA: Diagnosis not present

## 2023-09-25 DIAGNOSIS — Z741 Need for assistance with personal care: Secondary | ICD-10-CM | POA: Diagnosis not present

## 2023-09-25 DIAGNOSIS — M6281 Muscle weakness (generalized): Secondary | ICD-10-CM | POA: Diagnosis not present

## 2023-09-25 DIAGNOSIS — I4891 Unspecified atrial fibrillation: Secondary | ICD-10-CM | POA: Diagnosis not present

## 2023-09-28 DIAGNOSIS — R41841 Cognitive communication deficit: Secondary | ICD-10-CM | POA: Diagnosis not present

## 2023-09-28 DIAGNOSIS — I4891 Unspecified atrial fibrillation: Secondary | ICD-10-CM | POA: Diagnosis not present

## 2023-09-28 DIAGNOSIS — Z741 Need for assistance with personal care: Secondary | ICD-10-CM | POA: Diagnosis not present

## 2023-09-28 DIAGNOSIS — M6281 Muscle weakness (generalized): Secondary | ICD-10-CM | POA: Diagnosis not present

## 2023-09-29 DIAGNOSIS — F411 Generalized anxiety disorder: Secondary | ICD-10-CM | POA: Diagnosis not present

## 2023-09-29 DIAGNOSIS — I10 Essential (primary) hypertension: Secondary | ICD-10-CM | POA: Diagnosis not present

## 2023-09-29 DIAGNOSIS — Z741 Need for assistance with personal care: Secondary | ICD-10-CM | POA: Diagnosis not present

## 2023-09-29 DIAGNOSIS — R41841 Cognitive communication deficit: Secondary | ICD-10-CM | POA: Diagnosis not present

## 2023-09-29 DIAGNOSIS — M6281 Muscle weakness (generalized): Secondary | ICD-10-CM | POA: Diagnosis not present

## 2023-09-29 DIAGNOSIS — F5105 Insomnia due to other mental disorder: Secondary | ICD-10-CM | POA: Diagnosis not present

## 2023-09-29 DIAGNOSIS — I4891 Unspecified atrial fibrillation: Secondary | ICD-10-CM | POA: Diagnosis not present

## 2023-09-29 DIAGNOSIS — F01B2 Vascular dementia, moderate, with psychotic disturbance: Secondary | ICD-10-CM | POA: Diagnosis not present

## 2023-09-30 DIAGNOSIS — Z741 Need for assistance with personal care: Secondary | ICD-10-CM | POA: Diagnosis not present

## 2023-09-30 DIAGNOSIS — I4891 Unspecified atrial fibrillation: Secondary | ICD-10-CM | POA: Diagnosis not present

## 2023-09-30 DIAGNOSIS — M6281 Muscle weakness (generalized): Secondary | ICD-10-CM | POA: Diagnosis not present

## 2023-09-30 DIAGNOSIS — R41841 Cognitive communication deficit: Secondary | ICD-10-CM | POA: Diagnosis not present

## 2023-10-01 DIAGNOSIS — R41841 Cognitive communication deficit: Secondary | ICD-10-CM | POA: Diagnosis not present

## 2023-10-01 DIAGNOSIS — Z741 Need for assistance with personal care: Secondary | ICD-10-CM | POA: Diagnosis not present

## 2023-10-01 DIAGNOSIS — I4891 Unspecified atrial fibrillation: Secondary | ICD-10-CM | POA: Diagnosis not present

## 2023-10-01 DIAGNOSIS — M6281 Muscle weakness (generalized): Secondary | ICD-10-CM | POA: Diagnosis not present

## 2023-10-02 DIAGNOSIS — M6281 Muscle weakness (generalized): Secondary | ICD-10-CM | POA: Diagnosis not present

## 2023-10-02 DIAGNOSIS — R41841 Cognitive communication deficit: Secondary | ICD-10-CM | POA: Diagnosis not present

## 2023-10-02 DIAGNOSIS — I4891 Unspecified atrial fibrillation: Secondary | ICD-10-CM | POA: Diagnosis not present

## 2023-10-02 DIAGNOSIS — Z741 Need for assistance with personal care: Secondary | ICD-10-CM | POA: Diagnosis not present

## 2023-10-05 DIAGNOSIS — Z741 Need for assistance with personal care: Secondary | ICD-10-CM | POA: Diagnosis not present

## 2023-10-05 DIAGNOSIS — G4700 Insomnia, unspecified: Secondary | ICD-10-CM | POA: Diagnosis not present

## 2023-10-05 DIAGNOSIS — F03918 Unspecified dementia, unspecified severity, with other behavioral disturbance: Secondary | ICD-10-CM | POA: Diagnosis not present

## 2023-10-05 DIAGNOSIS — Z5181 Encounter for therapeutic drug level monitoring: Secondary | ICD-10-CM | POA: Diagnosis not present

## 2023-10-05 DIAGNOSIS — M6281 Muscle weakness (generalized): Secondary | ICD-10-CM | POA: Diagnosis not present

## 2023-10-05 DIAGNOSIS — I4891 Unspecified atrial fibrillation: Secondary | ICD-10-CM | POA: Diagnosis not present

## 2023-10-07 DIAGNOSIS — M6281 Muscle weakness (generalized): Secondary | ICD-10-CM | POA: Diagnosis not present

## 2023-10-07 DIAGNOSIS — Z741 Need for assistance with personal care: Secondary | ICD-10-CM | POA: Diagnosis not present

## 2023-10-07 DIAGNOSIS — I4891 Unspecified atrial fibrillation: Secondary | ICD-10-CM | POA: Diagnosis not present

## 2023-10-16 DIAGNOSIS — E876 Hypokalemia: Secondary | ICD-10-CM | POA: Diagnosis not present

## 2023-10-27 DIAGNOSIS — F411 Generalized anxiety disorder: Secondary | ICD-10-CM | POA: Diagnosis not present

## 2023-10-27 DIAGNOSIS — I10 Essential (primary) hypertension: Secondary | ICD-10-CM | POA: Diagnosis not present

## 2023-10-27 DIAGNOSIS — F01B2 Vascular dementia, moderate, with psychotic disturbance: Secondary | ICD-10-CM | POA: Diagnosis not present

## 2023-10-27 DIAGNOSIS — F5105 Insomnia due to other mental disorder: Secondary | ICD-10-CM | POA: Diagnosis not present

## 2023-10-28 DIAGNOSIS — R600 Localized edema: Secondary | ICD-10-CM | POA: Diagnosis not present

## 2023-10-30 DIAGNOSIS — R059 Cough, unspecified: Secondary | ICD-10-CM | POA: Diagnosis not present

## 2023-11-09 DIAGNOSIS — F03918 Unspecified dementia, unspecified severity, with other behavioral disturbance: Secondary | ICD-10-CM | POA: Diagnosis not present

## 2023-11-09 DIAGNOSIS — F29 Unspecified psychosis not due to a substance or known physiological condition: Secondary | ICD-10-CM | POA: Diagnosis not present

## 2023-11-09 DIAGNOSIS — F32A Depression, unspecified: Secondary | ICD-10-CM | POA: Diagnosis not present

## 2023-11-09 DIAGNOSIS — I4891 Unspecified atrial fibrillation: Secondary | ICD-10-CM | POA: Diagnosis not present

## 2023-11-16 DIAGNOSIS — Z741 Need for assistance with personal care: Secondary | ICD-10-CM | POA: Diagnosis not present

## 2023-11-16 DIAGNOSIS — R2681 Unsteadiness on feet: Secondary | ICD-10-CM | POA: Diagnosis not present

## 2023-11-16 DIAGNOSIS — I4891 Unspecified atrial fibrillation: Secondary | ICD-10-CM | POA: Diagnosis not present

## 2023-11-16 DIAGNOSIS — M6281 Muscle weakness (generalized): Secondary | ICD-10-CM | POA: Diagnosis not present

## 2023-11-16 DIAGNOSIS — Z7409 Other reduced mobility: Secondary | ICD-10-CM | POA: Diagnosis not present

## 2023-11-18 DIAGNOSIS — Z7409 Other reduced mobility: Secondary | ICD-10-CM | POA: Diagnosis not present

## 2023-11-18 DIAGNOSIS — R2681 Unsteadiness on feet: Secondary | ICD-10-CM | POA: Diagnosis not present

## 2023-11-18 DIAGNOSIS — I4891 Unspecified atrial fibrillation: Secondary | ICD-10-CM | POA: Diagnosis not present

## 2023-11-18 DIAGNOSIS — M6281 Muscle weakness (generalized): Secondary | ICD-10-CM | POA: Diagnosis not present

## 2023-11-18 DIAGNOSIS — Z741 Need for assistance with personal care: Secondary | ICD-10-CM | POA: Diagnosis not present

## 2023-11-20 DIAGNOSIS — G4700 Insomnia, unspecified: Secondary | ICD-10-CM | POA: Diagnosis not present

## 2023-11-22 DIAGNOSIS — E1159 Type 2 diabetes mellitus with other circulatory complications: Secondary | ICD-10-CM | POA: Diagnosis not present

## 2023-11-22 DIAGNOSIS — B351 Tinea unguium: Secondary | ICD-10-CM | POA: Diagnosis not present

## 2023-11-30 DIAGNOSIS — E039 Hypothyroidism, unspecified: Secondary | ICD-10-CM | POA: Diagnosis not present

## 2023-11-30 DIAGNOSIS — R58 Hemorrhage, not elsewhere classified: Secondary | ICD-10-CM | POA: Diagnosis not present

## 2023-11-30 DIAGNOSIS — Z950 Presence of cardiac pacemaker: Secondary | ICD-10-CM | POA: Diagnosis not present

## 2023-11-30 DIAGNOSIS — I4891 Unspecified atrial fibrillation: Secondary | ICD-10-CM | POA: Diagnosis not present

## 2023-11-30 DIAGNOSIS — R197 Diarrhea, unspecified: Secondary | ICD-10-CM | POA: Diagnosis not present

## 2023-11-30 DIAGNOSIS — I509 Heart failure, unspecified: Secondary | ICD-10-CM | POA: Diagnosis not present

## 2023-11-30 DIAGNOSIS — K219 Gastro-esophageal reflux disease without esophagitis: Secondary | ICD-10-CM | POA: Diagnosis not present

## 2023-11-30 DIAGNOSIS — Z7901 Long term (current) use of anticoagulants: Secondary | ICD-10-CM | POA: Diagnosis not present

## 2023-11-30 DIAGNOSIS — K921 Melena: Secondary | ICD-10-CM | POA: Diagnosis not present

## 2023-11-30 DIAGNOSIS — R0989 Other specified symptoms and signs involving the circulatory and respiratory systems: Secondary | ICD-10-CM | POA: Diagnosis not present

## 2023-11-30 DIAGNOSIS — Z853 Personal history of malignant neoplasm of breast: Secondary | ICD-10-CM | POA: Diagnosis not present

## 2023-11-30 DIAGNOSIS — I4510 Unspecified right bundle-branch block: Secondary | ICD-10-CM | POA: Diagnosis not present

## 2023-11-30 DIAGNOSIS — I444 Left anterior fascicular block: Secondary | ICD-10-CM | POA: Diagnosis not present

## 2023-11-30 DIAGNOSIS — R4182 Altered mental status, unspecified: Secondary | ICD-10-CM | POA: Diagnosis not present

## 2023-11-30 DIAGNOSIS — E119 Type 2 diabetes mellitus without complications: Secondary | ICD-10-CM | POA: Diagnosis not present

## 2023-11-30 DIAGNOSIS — Z79899 Other long term (current) drug therapy: Secondary | ICD-10-CM | POA: Diagnosis not present

## 2023-12-01 DIAGNOSIS — K921 Melena: Secondary | ICD-10-CM | POA: Diagnosis not present

## 2023-12-01 DIAGNOSIS — R630 Anorexia: Secondary | ICD-10-CM | POA: Diagnosis not present

## 2023-12-08 DIAGNOSIS — I10 Essential (primary) hypertension: Secondary | ICD-10-CM | POA: Diagnosis not present

## 2023-12-08 DIAGNOSIS — F01B2 Vascular dementia, moderate, with psychotic disturbance: Secondary | ICD-10-CM | POA: Diagnosis not present

## 2023-12-08 DIAGNOSIS — F411 Generalized anxiety disorder: Secondary | ICD-10-CM | POA: Diagnosis not present

## 2023-12-08 DIAGNOSIS — F5105 Insomnia due to other mental disorder: Secondary | ICD-10-CM | POA: Diagnosis not present

## 2023-12-09 DIAGNOSIS — I4891 Unspecified atrial fibrillation: Secondary | ICD-10-CM | POA: Diagnosis not present

## 2023-12-09 DIAGNOSIS — E785 Hyperlipidemia, unspecified: Secondary | ICD-10-CM | POA: Diagnosis not present

## 2023-12-09 DIAGNOSIS — I10 Essential (primary) hypertension: Secondary | ICD-10-CM | POA: Diagnosis not present

## 2023-12-09 DIAGNOSIS — I509 Heart failure, unspecified: Secondary | ICD-10-CM | POA: Diagnosis not present

## 2023-12-11 DIAGNOSIS — K30 Functional dyspepsia: Secondary | ICD-10-CM | POA: Diagnosis not present

## 2023-12-11 DIAGNOSIS — R197 Diarrhea, unspecified: Secondary | ICD-10-CM | POA: Diagnosis not present

## 2023-12-14 DIAGNOSIS — D649 Anemia, unspecified: Secondary | ICD-10-CM | POA: Diagnosis not present

## 2023-12-15 DIAGNOSIS — Z5181 Encounter for therapeutic drug level monitoring: Secondary | ICD-10-CM | POA: Diagnosis not present

## 2023-12-15 DIAGNOSIS — R799 Abnormal finding of blood chemistry, unspecified: Secondary | ICD-10-CM | POA: Diagnosis not present

## 2023-12-17 DIAGNOSIS — H30132 Disseminated chorioretinal inflammation, generalized, left eye: Secondary | ICD-10-CM | POA: Diagnosis not present

## 2023-12-24 DIAGNOSIS — M6281 Muscle weakness (generalized): Secondary | ICD-10-CM | POA: Diagnosis not present

## 2023-12-24 DIAGNOSIS — Z7409 Other reduced mobility: Secondary | ICD-10-CM | POA: Diagnosis not present

## 2023-12-24 DIAGNOSIS — R2681 Unsteadiness on feet: Secondary | ICD-10-CM | POA: Diagnosis not present

## 2023-12-24 DIAGNOSIS — I4891 Unspecified atrial fibrillation: Secondary | ICD-10-CM | POA: Diagnosis not present

## 2023-12-24 DIAGNOSIS — Z741 Need for assistance with personal care: Secondary | ICD-10-CM | POA: Diagnosis not present

## 2023-12-28 DIAGNOSIS — M6281 Muscle weakness (generalized): Secondary | ICD-10-CM | POA: Diagnosis not present

## 2023-12-28 DIAGNOSIS — Z7409 Other reduced mobility: Secondary | ICD-10-CM | POA: Diagnosis not present

## 2023-12-28 DIAGNOSIS — R059 Cough, unspecified: Secondary | ICD-10-CM | POA: Diagnosis not present

## 2023-12-28 DIAGNOSIS — I4891 Unspecified atrial fibrillation: Secondary | ICD-10-CM | POA: Diagnosis not present

## 2023-12-28 DIAGNOSIS — Z5181 Encounter for therapeutic drug level monitoring: Secondary | ICD-10-CM | POA: Diagnosis not present

## 2023-12-28 DIAGNOSIS — Z741 Need for assistance with personal care: Secondary | ICD-10-CM | POA: Diagnosis not present

## 2023-12-28 DIAGNOSIS — F419 Anxiety disorder, unspecified: Secondary | ICD-10-CM | POA: Diagnosis not present

## 2023-12-28 DIAGNOSIS — R2681 Unsteadiness on feet: Secondary | ICD-10-CM | POA: Diagnosis not present

## 2023-12-29 DIAGNOSIS — R059 Cough, unspecified: Secondary | ICD-10-CM | POA: Diagnosis not present

## 2023-12-29 DIAGNOSIS — R2681 Unsteadiness on feet: Secondary | ICD-10-CM | POA: Diagnosis not present

## 2023-12-29 DIAGNOSIS — Z7409 Other reduced mobility: Secondary | ICD-10-CM | POA: Diagnosis not present

## 2023-12-29 DIAGNOSIS — Z741 Need for assistance with personal care: Secondary | ICD-10-CM | POA: Diagnosis not present

## 2023-12-29 DIAGNOSIS — I4891 Unspecified atrial fibrillation: Secondary | ICD-10-CM | POA: Diagnosis not present

## 2023-12-29 DIAGNOSIS — M6281 Muscle weakness (generalized): Secondary | ICD-10-CM | POA: Diagnosis not present

## 2023-12-30 DIAGNOSIS — M6281 Muscle weakness (generalized): Secondary | ICD-10-CM | POA: Diagnosis not present

## 2023-12-30 DIAGNOSIS — I4891 Unspecified atrial fibrillation: Secondary | ICD-10-CM | POA: Diagnosis not present

## 2023-12-30 DIAGNOSIS — R2681 Unsteadiness on feet: Secondary | ICD-10-CM | POA: Diagnosis not present

## 2023-12-30 DIAGNOSIS — Z741 Need for assistance with personal care: Secondary | ICD-10-CM | POA: Diagnosis not present

## 2023-12-30 DIAGNOSIS — Z7409 Other reduced mobility: Secondary | ICD-10-CM | POA: Diagnosis not present

## 2023-12-31 DIAGNOSIS — M6281 Muscle weakness (generalized): Secondary | ICD-10-CM | POA: Diagnosis not present

## 2023-12-31 DIAGNOSIS — I4891 Unspecified atrial fibrillation: Secondary | ICD-10-CM | POA: Diagnosis not present

## 2023-12-31 DIAGNOSIS — R2681 Unsteadiness on feet: Secondary | ICD-10-CM | POA: Diagnosis not present

## 2023-12-31 DIAGNOSIS — Z741 Need for assistance with personal care: Secondary | ICD-10-CM | POA: Diagnosis not present

## 2023-12-31 DIAGNOSIS — Z7409 Other reduced mobility: Secondary | ICD-10-CM | POA: Diagnosis not present

## 2024-01-01 DIAGNOSIS — I4891 Unspecified atrial fibrillation: Secondary | ICD-10-CM | POA: Diagnosis not present

## 2024-01-01 DIAGNOSIS — M6281 Muscle weakness (generalized): Secondary | ICD-10-CM | POA: Diagnosis not present

## 2024-01-01 DIAGNOSIS — Z7409 Other reduced mobility: Secondary | ICD-10-CM | POA: Diagnosis not present

## 2024-01-01 DIAGNOSIS — Z741 Need for assistance with personal care: Secondary | ICD-10-CM | POA: Diagnosis not present

## 2024-01-01 DIAGNOSIS — R2681 Unsteadiness on feet: Secondary | ICD-10-CM | POA: Diagnosis not present

## 2024-01-05 DIAGNOSIS — I4891 Unspecified atrial fibrillation: Secondary | ICD-10-CM | POA: Diagnosis not present

## 2024-01-05 DIAGNOSIS — Z7409 Other reduced mobility: Secondary | ICD-10-CM | POA: Diagnosis not present

## 2024-01-05 DIAGNOSIS — M6281 Muscle weakness (generalized): Secondary | ICD-10-CM | POA: Diagnosis not present

## 2024-01-05 DIAGNOSIS — R2681 Unsteadiness on feet: Secondary | ICD-10-CM | POA: Diagnosis not present

## 2024-01-05 DIAGNOSIS — Z741 Need for assistance with personal care: Secondary | ICD-10-CM | POA: Diagnosis not present

## 2024-01-06 DIAGNOSIS — Z741 Need for assistance with personal care: Secondary | ICD-10-CM | POA: Diagnosis not present

## 2024-01-06 DIAGNOSIS — R2681 Unsteadiness on feet: Secondary | ICD-10-CM | POA: Diagnosis not present

## 2024-01-06 DIAGNOSIS — I4891 Unspecified atrial fibrillation: Secondary | ICD-10-CM | POA: Diagnosis not present

## 2024-01-06 DIAGNOSIS — M6281 Muscle weakness (generalized): Secondary | ICD-10-CM | POA: Diagnosis not present

## 2024-01-06 DIAGNOSIS — Z7409 Other reduced mobility: Secondary | ICD-10-CM | POA: Diagnosis not present

## 2024-01-11 DIAGNOSIS — I509 Heart failure, unspecified: Secondary | ICD-10-CM | POA: Diagnosis not present

## 2024-01-11 DIAGNOSIS — I4891 Unspecified atrial fibrillation: Secondary | ICD-10-CM | POA: Diagnosis not present

## 2024-01-11 DIAGNOSIS — I10 Essential (primary) hypertension: Secondary | ICD-10-CM | POA: Diagnosis not present

## 2024-01-11 DIAGNOSIS — R2681 Unsteadiness on feet: Secondary | ICD-10-CM | POA: Diagnosis not present

## 2024-01-11 DIAGNOSIS — M6281 Muscle weakness (generalized): Secondary | ICD-10-CM | POA: Diagnosis not present

## 2024-01-11 DIAGNOSIS — Z7409 Other reduced mobility: Secondary | ICD-10-CM | POA: Diagnosis not present

## 2024-01-11 DIAGNOSIS — Z741 Need for assistance with personal care: Secondary | ICD-10-CM | POA: Diagnosis not present

## 2024-01-12 DIAGNOSIS — I4891 Unspecified atrial fibrillation: Secondary | ICD-10-CM | POA: Diagnosis not present

## 2024-01-12 DIAGNOSIS — Z7409 Other reduced mobility: Secondary | ICD-10-CM | POA: Diagnosis not present

## 2024-01-12 DIAGNOSIS — M6281 Muscle weakness (generalized): Secondary | ICD-10-CM | POA: Diagnosis not present

## 2024-01-12 DIAGNOSIS — R2681 Unsteadiness on feet: Secondary | ICD-10-CM | POA: Diagnosis not present

## 2024-01-12 DIAGNOSIS — Z741 Need for assistance with personal care: Secondary | ICD-10-CM | POA: Diagnosis not present

## 2024-01-13 DIAGNOSIS — R2681 Unsteadiness on feet: Secondary | ICD-10-CM | POA: Diagnosis not present

## 2024-01-13 DIAGNOSIS — Z7409 Other reduced mobility: Secondary | ICD-10-CM | POA: Diagnosis not present

## 2024-01-13 DIAGNOSIS — I4891 Unspecified atrial fibrillation: Secondary | ICD-10-CM | POA: Diagnosis not present

## 2024-01-13 DIAGNOSIS — M6281 Muscle weakness (generalized): Secondary | ICD-10-CM | POA: Diagnosis not present

## 2024-01-13 DIAGNOSIS — Z741 Need for assistance with personal care: Secondary | ICD-10-CM | POA: Diagnosis not present

## 2024-01-14 DIAGNOSIS — I4891 Unspecified atrial fibrillation: Secondary | ICD-10-CM | POA: Diagnosis not present

## 2024-01-14 DIAGNOSIS — R2681 Unsteadiness on feet: Secondary | ICD-10-CM | POA: Diagnosis not present

## 2024-01-14 DIAGNOSIS — M6281 Muscle weakness (generalized): Secondary | ICD-10-CM | POA: Diagnosis not present

## 2024-01-14 DIAGNOSIS — Z741 Need for assistance with personal care: Secondary | ICD-10-CM | POA: Diagnosis not present

## 2024-01-14 DIAGNOSIS — Z7409 Other reduced mobility: Secondary | ICD-10-CM | POA: Diagnosis not present

## 2024-01-15 DIAGNOSIS — R059 Cough, unspecified: Secondary | ICD-10-CM | POA: Diagnosis not present

## 2024-01-15 DIAGNOSIS — R918 Other nonspecific abnormal finding of lung field: Secondary | ICD-10-CM | POA: Diagnosis not present

## 2024-01-19 DIAGNOSIS — Z741 Need for assistance with personal care: Secondary | ICD-10-CM | POA: Diagnosis not present

## 2024-01-19 DIAGNOSIS — Z7409 Other reduced mobility: Secondary | ICD-10-CM | POA: Diagnosis not present

## 2024-01-19 DIAGNOSIS — M6281 Muscle weakness (generalized): Secondary | ICD-10-CM | POA: Diagnosis not present

## 2024-01-19 DIAGNOSIS — I4891 Unspecified atrial fibrillation: Secondary | ICD-10-CM | POA: Diagnosis not present

## 2024-01-19 DIAGNOSIS — R2681 Unsteadiness on feet: Secondary | ICD-10-CM | POA: Diagnosis not present

## 2024-01-20 DIAGNOSIS — M6281 Muscle weakness (generalized): Secondary | ICD-10-CM | POA: Diagnosis not present

## 2024-01-20 DIAGNOSIS — R5381 Other malaise: Secondary | ICD-10-CM | POA: Diagnosis not present

## 2024-01-20 DIAGNOSIS — R2681 Unsteadiness on feet: Secondary | ICD-10-CM | POA: Diagnosis not present

## 2024-01-20 DIAGNOSIS — Z741 Need for assistance with personal care: Secondary | ICD-10-CM | POA: Diagnosis not present

## 2024-01-20 DIAGNOSIS — I4891 Unspecified atrial fibrillation: Secondary | ICD-10-CM | POA: Diagnosis not present

## 2024-01-20 DIAGNOSIS — Z7409 Other reduced mobility: Secondary | ICD-10-CM | POA: Diagnosis not present

## 2024-01-21 DIAGNOSIS — Z741 Need for assistance with personal care: Secondary | ICD-10-CM | POA: Diagnosis not present

## 2024-01-21 DIAGNOSIS — R2681 Unsteadiness on feet: Secondary | ICD-10-CM | POA: Diagnosis not present

## 2024-01-21 DIAGNOSIS — Z7409 Other reduced mobility: Secondary | ICD-10-CM | POA: Diagnosis not present

## 2024-01-21 DIAGNOSIS — M6281 Muscle weakness (generalized): Secondary | ICD-10-CM | POA: Diagnosis not present

## 2024-01-21 DIAGNOSIS — I4891 Unspecified atrial fibrillation: Secondary | ICD-10-CM | POA: Diagnosis not present

## 2024-01-22 DIAGNOSIS — G4700 Insomnia, unspecified: Secondary | ICD-10-CM | POA: Diagnosis not present

## 2024-01-22 DIAGNOSIS — R799 Abnormal finding of blood chemistry, unspecified: Secondary | ICD-10-CM | POA: Diagnosis not present

## 2024-01-22 DIAGNOSIS — D649 Anemia, unspecified: Secondary | ICD-10-CM | POA: Diagnosis not present

## 2024-01-23 DIAGNOSIS — D649 Anemia, unspecified: Secondary | ICD-10-CM | POA: Diagnosis not present

## 2024-01-25 DIAGNOSIS — I4891 Unspecified atrial fibrillation: Secondary | ICD-10-CM | POA: Diagnosis not present

## 2024-01-25 DIAGNOSIS — Z741 Need for assistance with personal care: Secondary | ICD-10-CM | POA: Diagnosis not present

## 2024-01-25 DIAGNOSIS — F01B2 Vascular dementia, moderate, with psychotic disturbance: Secondary | ICD-10-CM | POA: Diagnosis not present

## 2024-01-25 DIAGNOSIS — F411 Generalized anxiety disorder: Secondary | ICD-10-CM | POA: Diagnosis not present

## 2024-01-25 DIAGNOSIS — F5105 Insomnia due to other mental disorder: Secondary | ICD-10-CM | POA: Diagnosis not present

## 2024-01-25 DIAGNOSIS — I10 Essential (primary) hypertension: Secondary | ICD-10-CM | POA: Diagnosis not present

## 2024-01-25 DIAGNOSIS — M6281 Muscle weakness (generalized): Secondary | ICD-10-CM | POA: Diagnosis not present

## 2024-01-25 DIAGNOSIS — R2681 Unsteadiness on feet: Secondary | ICD-10-CM | POA: Diagnosis not present

## 2024-01-25 DIAGNOSIS — Z7409 Other reduced mobility: Secondary | ICD-10-CM | POA: Diagnosis not present

## 2024-01-26 DIAGNOSIS — R2681 Unsteadiness on feet: Secondary | ICD-10-CM | POA: Diagnosis not present

## 2024-01-26 DIAGNOSIS — I4891 Unspecified atrial fibrillation: Secondary | ICD-10-CM | POA: Diagnosis not present

## 2024-01-26 DIAGNOSIS — M6281 Muscle weakness (generalized): Secondary | ICD-10-CM | POA: Diagnosis not present

## 2024-01-26 DIAGNOSIS — Z741 Need for assistance with personal care: Secondary | ICD-10-CM | POA: Diagnosis not present

## 2024-01-26 DIAGNOSIS — Z7409 Other reduced mobility: Secondary | ICD-10-CM | POA: Diagnosis not present

## 2024-01-27 DIAGNOSIS — M6281 Muscle weakness (generalized): Secondary | ICD-10-CM | POA: Diagnosis not present

## 2024-01-27 DIAGNOSIS — Z7409 Other reduced mobility: Secondary | ICD-10-CM | POA: Diagnosis not present

## 2024-01-27 DIAGNOSIS — I4891 Unspecified atrial fibrillation: Secondary | ICD-10-CM | POA: Diagnosis not present

## 2024-01-27 DIAGNOSIS — Z741 Need for assistance with personal care: Secondary | ICD-10-CM | POA: Diagnosis not present

## 2024-01-27 DIAGNOSIS — R2681 Unsteadiness on feet: Secondary | ICD-10-CM | POA: Diagnosis not present

## 2024-01-28 DIAGNOSIS — M6281 Muscle weakness (generalized): Secondary | ICD-10-CM | POA: Diagnosis not present

## 2024-01-28 DIAGNOSIS — Z741 Need for assistance with personal care: Secondary | ICD-10-CM | POA: Diagnosis not present

## 2024-01-28 DIAGNOSIS — R2681 Unsteadiness on feet: Secondary | ICD-10-CM | POA: Diagnosis not present

## 2024-01-28 DIAGNOSIS — Z7409 Other reduced mobility: Secondary | ICD-10-CM | POA: Diagnosis not present

## 2024-01-28 DIAGNOSIS — I4891 Unspecified atrial fibrillation: Secondary | ICD-10-CM | POA: Diagnosis not present

## 2024-01-29 DIAGNOSIS — R2681 Unsteadiness on feet: Secondary | ICD-10-CM | POA: Diagnosis not present

## 2024-01-29 DIAGNOSIS — Z7409 Other reduced mobility: Secondary | ICD-10-CM | POA: Diagnosis not present

## 2024-01-29 DIAGNOSIS — I4891 Unspecified atrial fibrillation: Secondary | ICD-10-CM | POA: Diagnosis not present

## 2024-01-29 DIAGNOSIS — Z741 Need for assistance with personal care: Secondary | ICD-10-CM | POA: Diagnosis not present

## 2024-01-29 DIAGNOSIS — M6281 Muscle weakness (generalized): Secondary | ICD-10-CM | POA: Diagnosis not present

## 2024-02-01 DIAGNOSIS — I4891 Unspecified atrial fibrillation: Secondary | ICD-10-CM | POA: Diagnosis not present

## 2024-02-01 DIAGNOSIS — Z7409 Other reduced mobility: Secondary | ICD-10-CM | POA: Diagnosis not present

## 2024-02-01 DIAGNOSIS — Z741 Need for assistance with personal care: Secondary | ICD-10-CM | POA: Diagnosis not present

## 2024-02-01 DIAGNOSIS — R2681 Unsteadiness on feet: Secondary | ICD-10-CM | POA: Diagnosis not present

## 2024-02-01 DIAGNOSIS — M6281 Muscle weakness (generalized): Secondary | ICD-10-CM | POA: Diagnosis not present

## 2024-02-16 DIAGNOSIS — E785 Hyperlipidemia, unspecified: Secondary | ICD-10-CM | POA: Diagnosis not present

## 2024-02-16 DIAGNOSIS — I4891 Unspecified atrial fibrillation: Secondary | ICD-10-CM | POA: Diagnosis not present

## 2024-02-16 DIAGNOSIS — I10 Essential (primary) hypertension: Secondary | ICD-10-CM | POA: Diagnosis not present

## 2024-02-16 DIAGNOSIS — I509 Heart failure, unspecified: Secondary | ICD-10-CM | POA: Diagnosis not present

## 2024-02-23 DIAGNOSIS — K625 Hemorrhage of anus and rectum: Secondary | ICD-10-CM | POA: Diagnosis not present

## 2024-02-23 DIAGNOSIS — D539 Nutritional anemia, unspecified: Secondary | ICD-10-CM | POA: Diagnosis not present

## 2024-03-07 DIAGNOSIS — G4700 Insomnia, unspecified: Secondary | ICD-10-CM | POA: Diagnosis not present

## 2024-03-07 DIAGNOSIS — I10 Essential (primary) hypertension: Secondary | ICD-10-CM | POA: Diagnosis not present

## 2024-03-07 DIAGNOSIS — F32A Depression, unspecified: Secondary | ICD-10-CM | POA: Diagnosis not present

## 2024-03-07 DIAGNOSIS — L853 Xerosis cutis: Secondary | ICD-10-CM | POA: Diagnosis not present

## 2024-03-12 DIAGNOSIS — F411 Generalized anxiety disorder: Secondary | ICD-10-CM | POA: Diagnosis not present

## 2024-03-12 DIAGNOSIS — F5105 Insomnia due to other mental disorder: Secondary | ICD-10-CM | POA: Diagnosis not present

## 2024-03-12 DIAGNOSIS — F01B2 Vascular dementia, moderate, with psychotic disturbance: Secondary | ICD-10-CM | POA: Diagnosis not present

## 2024-03-12 DIAGNOSIS — I10 Essential (primary) hypertension: Secondary | ICD-10-CM | POA: Diagnosis not present

## 2024-03-21 DIAGNOSIS — I10 Essential (primary) hypertension: Secondary | ICD-10-CM | POA: Diagnosis not present

## 2024-03-21 DIAGNOSIS — F32A Depression, unspecified: Secondary | ICD-10-CM | POA: Diagnosis not present

## 2024-03-21 DIAGNOSIS — H6123 Impacted cerumen, bilateral: Secondary | ICD-10-CM | POA: Diagnosis not present

## 2024-03-21 DIAGNOSIS — I4891 Unspecified atrial fibrillation: Secondary | ICD-10-CM | POA: Diagnosis not present

## 2024-03-28 DIAGNOSIS — R410 Disorientation, unspecified: Secondary | ICD-10-CM | POA: Diagnosis not present

## 2024-03-28 DIAGNOSIS — F410 Panic disorder [episodic paroxysmal anxiety] without agoraphobia: Secondary | ICD-10-CM | POA: Diagnosis not present

## 2024-03-28 DIAGNOSIS — F32A Depression, unspecified: Secondary | ICD-10-CM | POA: Diagnosis not present

## 2024-03-28 DIAGNOSIS — R3589 Other polyuria: Secondary | ICD-10-CM | POA: Diagnosis not present

## 2024-03-29 DIAGNOSIS — M6281 Muscle weakness (generalized): Secondary | ICD-10-CM | POA: Diagnosis not present

## 2024-03-29 DIAGNOSIS — Z741 Need for assistance with personal care: Secondary | ICD-10-CM | POA: Diagnosis not present

## 2024-03-29 DIAGNOSIS — I4891 Unspecified atrial fibrillation: Secondary | ICD-10-CM | POA: Diagnosis not present

## 2024-03-29 DIAGNOSIS — Z7409 Other reduced mobility: Secondary | ICD-10-CM | POA: Diagnosis not present

## 2024-03-29 DIAGNOSIS — N390 Urinary tract infection, site not specified: Secondary | ICD-10-CM | POA: Diagnosis not present

## 2024-03-29 DIAGNOSIS — R2681 Unsteadiness on feet: Secondary | ICD-10-CM | POA: Diagnosis not present

## 2024-03-30 DIAGNOSIS — M6281 Muscle weakness (generalized): Secondary | ICD-10-CM | POA: Diagnosis not present

## 2024-03-30 DIAGNOSIS — Z7409 Other reduced mobility: Secondary | ICD-10-CM | POA: Diagnosis not present

## 2024-03-30 DIAGNOSIS — Z741 Need for assistance with personal care: Secondary | ICD-10-CM | POA: Diagnosis not present

## 2024-03-30 DIAGNOSIS — R2681 Unsteadiness on feet: Secondary | ICD-10-CM | POA: Diagnosis not present

## 2024-03-30 DIAGNOSIS — I4891 Unspecified atrial fibrillation: Secondary | ICD-10-CM | POA: Diagnosis not present

## 2024-03-31 DIAGNOSIS — R2681 Unsteadiness on feet: Secondary | ICD-10-CM | POA: Diagnosis not present

## 2024-03-31 DIAGNOSIS — Z7409 Other reduced mobility: Secondary | ICD-10-CM | POA: Diagnosis not present

## 2024-03-31 DIAGNOSIS — M6281 Muscle weakness (generalized): Secondary | ICD-10-CM | POA: Diagnosis not present

## 2024-03-31 DIAGNOSIS — I4891 Unspecified atrial fibrillation: Secondary | ICD-10-CM | POA: Diagnosis not present

## 2024-03-31 DIAGNOSIS — Z741 Need for assistance with personal care: Secondary | ICD-10-CM | POA: Diagnosis not present

## 2024-03-31 DIAGNOSIS — Z961 Presence of intraocular lens: Secondary | ICD-10-CM | POA: Diagnosis not present

## 2024-03-31 DIAGNOSIS — H524 Presbyopia: Secondary | ICD-10-CM | POA: Diagnosis not present

## 2024-04-02 DIAGNOSIS — E441 Mild protein-calorie malnutrition: Secondary | ICD-10-CM | POA: Diagnosis not present

## 2024-04-02 DIAGNOSIS — D649 Anemia, unspecified: Secondary | ICD-10-CM | POA: Diagnosis not present

## 2024-04-02 DIAGNOSIS — E559 Vitamin D deficiency, unspecified: Secondary | ICD-10-CM | POA: Diagnosis not present

## 2024-04-02 DIAGNOSIS — E11621 Type 2 diabetes mellitus with foot ulcer: Secondary | ICD-10-CM | POA: Diagnosis not present

## 2024-04-04 DIAGNOSIS — Z7409 Other reduced mobility: Secondary | ICD-10-CM | POA: Diagnosis not present

## 2024-04-04 DIAGNOSIS — Z741 Need for assistance with personal care: Secondary | ICD-10-CM | POA: Diagnosis not present

## 2024-04-04 DIAGNOSIS — R2681 Unsteadiness on feet: Secondary | ICD-10-CM | POA: Diagnosis not present

## 2024-04-04 DIAGNOSIS — I4891 Unspecified atrial fibrillation: Secondary | ICD-10-CM | POA: Diagnosis not present

## 2024-04-04 DIAGNOSIS — M6281 Muscle weakness (generalized): Secondary | ICD-10-CM | POA: Diagnosis not present

## 2024-04-05 DIAGNOSIS — I4891 Unspecified atrial fibrillation: Secondary | ICD-10-CM | POA: Diagnosis not present

## 2024-04-05 DIAGNOSIS — M6281 Muscle weakness (generalized): Secondary | ICD-10-CM | POA: Diagnosis not present

## 2024-04-05 DIAGNOSIS — R2681 Unsteadiness on feet: Secondary | ICD-10-CM | POA: Diagnosis not present

## 2024-04-05 DIAGNOSIS — Z7409 Other reduced mobility: Secondary | ICD-10-CM | POA: Diagnosis not present

## 2024-04-05 DIAGNOSIS — Z741 Need for assistance with personal care: Secondary | ICD-10-CM | POA: Diagnosis not present

## 2024-04-07 DIAGNOSIS — R2681 Unsteadiness on feet: Secondary | ICD-10-CM | POA: Diagnosis not present

## 2024-04-07 DIAGNOSIS — Z7409 Other reduced mobility: Secondary | ICD-10-CM | POA: Diagnosis not present

## 2024-04-07 DIAGNOSIS — M6281 Muscle weakness (generalized): Secondary | ICD-10-CM | POA: Diagnosis not present

## 2024-04-07 DIAGNOSIS — E785 Hyperlipidemia, unspecified: Secondary | ICD-10-CM | POA: Diagnosis not present

## 2024-04-07 DIAGNOSIS — Z741 Need for assistance with personal care: Secondary | ICD-10-CM | POA: Diagnosis not present

## 2024-04-07 DIAGNOSIS — I4891 Unspecified atrial fibrillation: Secondary | ICD-10-CM | POA: Diagnosis not present

## 2024-04-07 DIAGNOSIS — D509 Iron deficiency anemia, unspecified: Secondary | ICD-10-CM | POA: Diagnosis not present

## 2024-04-08 DIAGNOSIS — E1159 Type 2 diabetes mellitus with other circulatory complications: Secondary | ICD-10-CM | POA: Diagnosis not present

## 2024-04-08 DIAGNOSIS — B351 Tinea unguium: Secondary | ICD-10-CM | POA: Diagnosis not present

## 2024-04-11 DIAGNOSIS — Z741 Need for assistance with personal care: Secondary | ICD-10-CM | POA: Diagnosis not present

## 2024-04-11 DIAGNOSIS — R2681 Unsteadiness on feet: Secondary | ICD-10-CM | POA: Diagnosis not present

## 2024-04-11 DIAGNOSIS — I4891 Unspecified atrial fibrillation: Secondary | ICD-10-CM | POA: Diagnosis not present

## 2024-04-11 DIAGNOSIS — Z7409 Other reduced mobility: Secondary | ICD-10-CM | POA: Diagnosis not present

## 2024-04-11 DIAGNOSIS — M6281 Muscle weakness (generalized): Secondary | ICD-10-CM | POA: Diagnosis not present

## 2024-04-12 DIAGNOSIS — Z7409 Other reduced mobility: Secondary | ICD-10-CM | POA: Diagnosis not present

## 2024-04-12 DIAGNOSIS — I4891 Unspecified atrial fibrillation: Secondary | ICD-10-CM | POA: Diagnosis not present

## 2024-04-12 DIAGNOSIS — R2681 Unsteadiness on feet: Secondary | ICD-10-CM | POA: Diagnosis not present

## 2024-04-12 DIAGNOSIS — M6281 Muscle weakness (generalized): Secondary | ICD-10-CM | POA: Diagnosis not present

## 2024-04-12 DIAGNOSIS — Z741 Need for assistance with personal care: Secondary | ICD-10-CM | POA: Diagnosis not present

## 2024-04-13 DIAGNOSIS — M6281 Muscle weakness (generalized): Secondary | ICD-10-CM | POA: Diagnosis not present

## 2024-04-13 DIAGNOSIS — Z7409 Other reduced mobility: Secondary | ICD-10-CM | POA: Diagnosis not present

## 2024-04-13 DIAGNOSIS — I4891 Unspecified atrial fibrillation: Secondary | ICD-10-CM | POA: Diagnosis not present

## 2024-04-13 DIAGNOSIS — R2681 Unsteadiness on feet: Secondary | ICD-10-CM | POA: Diagnosis not present

## 2024-04-13 DIAGNOSIS — Z741 Need for assistance with personal care: Secondary | ICD-10-CM | POA: Diagnosis not present

## 2024-04-14 DIAGNOSIS — I4891 Unspecified atrial fibrillation: Secondary | ICD-10-CM | POA: Diagnosis not present

## 2024-04-14 DIAGNOSIS — Z7409 Other reduced mobility: Secondary | ICD-10-CM | POA: Diagnosis not present

## 2024-04-14 DIAGNOSIS — M6281 Muscle weakness (generalized): Secondary | ICD-10-CM | POA: Diagnosis not present

## 2024-04-14 DIAGNOSIS — R2681 Unsteadiness on feet: Secondary | ICD-10-CM | POA: Diagnosis not present

## 2024-04-14 DIAGNOSIS — Z741 Need for assistance with personal care: Secondary | ICD-10-CM | POA: Diagnosis not present

## 2024-04-15 DIAGNOSIS — Z7409 Other reduced mobility: Secondary | ICD-10-CM | POA: Diagnosis not present

## 2024-04-15 DIAGNOSIS — M6281 Muscle weakness (generalized): Secondary | ICD-10-CM | POA: Diagnosis not present

## 2024-04-15 DIAGNOSIS — I4891 Unspecified atrial fibrillation: Secondary | ICD-10-CM | POA: Diagnosis not present

## 2024-04-15 DIAGNOSIS — Z741 Need for assistance with personal care: Secondary | ICD-10-CM | POA: Diagnosis not present

## 2024-04-15 DIAGNOSIS — R2681 Unsteadiness on feet: Secondary | ICD-10-CM | POA: Diagnosis not present

## 2024-04-18 DIAGNOSIS — I4891 Unspecified atrial fibrillation: Secondary | ICD-10-CM | POA: Diagnosis not present

## 2024-04-18 DIAGNOSIS — Z741 Need for assistance with personal care: Secondary | ICD-10-CM | POA: Diagnosis not present

## 2024-04-18 DIAGNOSIS — Z7409 Other reduced mobility: Secondary | ICD-10-CM | POA: Diagnosis not present

## 2024-04-18 DIAGNOSIS — R2681 Unsteadiness on feet: Secondary | ICD-10-CM | POA: Diagnosis not present

## 2024-04-18 DIAGNOSIS — M6281 Muscle weakness (generalized): Secondary | ICD-10-CM | POA: Diagnosis not present

## 2024-04-19 DIAGNOSIS — Z7409 Other reduced mobility: Secondary | ICD-10-CM | POA: Diagnosis not present

## 2024-04-19 DIAGNOSIS — F411 Generalized anxiety disorder: Secondary | ICD-10-CM | POA: Diagnosis not present

## 2024-04-19 DIAGNOSIS — I10 Essential (primary) hypertension: Secondary | ICD-10-CM | POA: Diagnosis not present

## 2024-04-19 DIAGNOSIS — F5105 Insomnia due to other mental disorder: Secondary | ICD-10-CM | POA: Diagnosis not present

## 2024-04-19 DIAGNOSIS — F01B2 Vascular dementia, moderate, with psychotic disturbance: Secondary | ICD-10-CM | POA: Diagnosis not present

## 2024-04-19 DIAGNOSIS — M6281 Muscle weakness (generalized): Secondary | ICD-10-CM | POA: Diagnosis not present

## 2024-04-19 DIAGNOSIS — R2681 Unsteadiness on feet: Secondary | ICD-10-CM | POA: Diagnosis not present

## 2024-04-19 DIAGNOSIS — I4891 Unspecified atrial fibrillation: Secondary | ICD-10-CM | POA: Diagnosis not present

## 2024-04-19 DIAGNOSIS — Z741 Need for assistance with personal care: Secondary | ICD-10-CM | POA: Diagnosis not present

## 2024-04-20 DIAGNOSIS — M6281 Muscle weakness (generalized): Secondary | ICD-10-CM | POA: Diagnosis not present

## 2024-04-20 DIAGNOSIS — Z7409 Other reduced mobility: Secondary | ICD-10-CM | POA: Diagnosis not present

## 2024-04-20 DIAGNOSIS — Z741 Need for assistance with personal care: Secondary | ICD-10-CM | POA: Diagnosis not present

## 2024-04-20 DIAGNOSIS — I4891 Unspecified atrial fibrillation: Secondary | ICD-10-CM | POA: Diagnosis not present

## 2024-04-20 DIAGNOSIS — R2681 Unsteadiness on feet: Secondary | ICD-10-CM | POA: Diagnosis not present

## 2024-04-21 DIAGNOSIS — I4891 Unspecified atrial fibrillation: Secondary | ICD-10-CM | POA: Diagnosis not present

## 2024-04-21 DIAGNOSIS — Z7409 Other reduced mobility: Secondary | ICD-10-CM | POA: Diagnosis not present

## 2024-04-21 DIAGNOSIS — R2681 Unsteadiness on feet: Secondary | ICD-10-CM | POA: Diagnosis not present

## 2024-04-21 DIAGNOSIS — Z741 Need for assistance with personal care: Secondary | ICD-10-CM | POA: Diagnosis not present

## 2024-04-21 DIAGNOSIS — M6281 Muscle weakness (generalized): Secondary | ICD-10-CM | POA: Diagnosis not present

## 2024-04-22 DIAGNOSIS — Z741 Need for assistance with personal care: Secondary | ICD-10-CM | POA: Diagnosis not present

## 2024-04-22 DIAGNOSIS — Z7409 Other reduced mobility: Secondary | ICD-10-CM | POA: Diagnosis not present

## 2024-04-22 DIAGNOSIS — R2681 Unsteadiness on feet: Secondary | ICD-10-CM | POA: Diagnosis not present

## 2024-04-22 DIAGNOSIS — I4891 Unspecified atrial fibrillation: Secondary | ICD-10-CM | POA: Diagnosis not present

## 2024-04-22 DIAGNOSIS — M6281 Muscle weakness (generalized): Secondary | ICD-10-CM | POA: Diagnosis not present

## 2024-04-25 DIAGNOSIS — I4891 Unspecified atrial fibrillation: Secondary | ICD-10-CM | POA: Diagnosis not present

## 2024-04-25 DIAGNOSIS — R2681 Unsteadiness on feet: Secondary | ICD-10-CM | POA: Diagnosis not present

## 2024-04-25 DIAGNOSIS — Z7409 Other reduced mobility: Secondary | ICD-10-CM | POA: Diagnosis not present

## 2024-04-25 DIAGNOSIS — Z741 Need for assistance with personal care: Secondary | ICD-10-CM | POA: Diagnosis not present

## 2024-04-25 DIAGNOSIS — M6281 Muscle weakness (generalized): Secondary | ICD-10-CM | POA: Diagnosis not present

## 2024-04-26 DIAGNOSIS — M6281 Muscle weakness (generalized): Secondary | ICD-10-CM | POA: Diagnosis not present

## 2024-04-26 DIAGNOSIS — Z7409 Other reduced mobility: Secondary | ICD-10-CM | POA: Diagnosis not present

## 2024-04-26 DIAGNOSIS — E43 Unspecified severe protein-calorie malnutrition: Secondary | ICD-10-CM | POA: Diagnosis not present

## 2024-04-26 DIAGNOSIS — I509 Heart failure, unspecified: Secondary | ICD-10-CM | POA: Diagnosis not present

## 2024-04-26 DIAGNOSIS — I4891 Unspecified atrial fibrillation: Secondary | ICD-10-CM | POA: Diagnosis not present

## 2024-04-26 DIAGNOSIS — Z741 Need for assistance with personal care: Secondary | ICD-10-CM | POA: Diagnosis not present

## 2024-04-26 DIAGNOSIS — R2681 Unsteadiness on feet: Secondary | ICD-10-CM | POA: Diagnosis not present

## 2024-04-26 DIAGNOSIS — E785 Hyperlipidemia, unspecified: Secondary | ICD-10-CM | POA: Diagnosis not present

## 2024-04-27 DIAGNOSIS — M6281 Muscle weakness (generalized): Secondary | ICD-10-CM | POA: Diagnosis not present

## 2024-04-27 DIAGNOSIS — R2681 Unsteadiness on feet: Secondary | ICD-10-CM | POA: Diagnosis not present

## 2024-04-27 DIAGNOSIS — Z7409 Other reduced mobility: Secondary | ICD-10-CM | POA: Diagnosis not present

## 2024-04-27 DIAGNOSIS — I4891 Unspecified atrial fibrillation: Secondary | ICD-10-CM | POA: Diagnosis not present

## 2024-04-27 DIAGNOSIS — Z741 Need for assistance with personal care: Secondary | ICD-10-CM | POA: Diagnosis not present

## 2024-04-28 DIAGNOSIS — Z741 Need for assistance with personal care: Secondary | ICD-10-CM | POA: Diagnosis not present

## 2024-04-28 DIAGNOSIS — M6281 Muscle weakness (generalized): Secondary | ICD-10-CM | POA: Diagnosis not present

## 2024-04-28 DIAGNOSIS — I4891 Unspecified atrial fibrillation: Secondary | ICD-10-CM | POA: Diagnosis not present

## 2024-04-28 DIAGNOSIS — R2681 Unsteadiness on feet: Secondary | ICD-10-CM | POA: Diagnosis not present

## 2024-04-28 DIAGNOSIS — Z7409 Other reduced mobility: Secondary | ICD-10-CM | POA: Diagnosis not present

## 2024-04-29 DIAGNOSIS — Z741 Need for assistance with personal care: Secondary | ICD-10-CM | POA: Diagnosis not present

## 2024-04-29 DIAGNOSIS — M6281 Muscle weakness (generalized): Secondary | ICD-10-CM | POA: Diagnosis not present

## 2024-04-29 DIAGNOSIS — R2681 Unsteadiness on feet: Secondary | ICD-10-CM | POA: Diagnosis not present

## 2024-04-29 DIAGNOSIS — Z7409 Other reduced mobility: Secondary | ICD-10-CM | POA: Diagnosis not present

## 2024-04-29 DIAGNOSIS — I4891 Unspecified atrial fibrillation: Secondary | ICD-10-CM | POA: Diagnosis not present

## 2024-05-02 DIAGNOSIS — Z741 Need for assistance with personal care: Secondary | ICD-10-CM | POA: Diagnosis not present

## 2024-05-02 DIAGNOSIS — R2681 Unsteadiness on feet: Secondary | ICD-10-CM | POA: Diagnosis not present

## 2024-05-02 DIAGNOSIS — I4891 Unspecified atrial fibrillation: Secondary | ICD-10-CM | POA: Diagnosis not present

## 2024-05-02 DIAGNOSIS — Z7409 Other reduced mobility: Secondary | ICD-10-CM | POA: Diagnosis not present

## 2024-05-02 DIAGNOSIS — M6281 Muscle weakness (generalized): Secondary | ICD-10-CM | POA: Diagnosis not present

## 2024-05-03 DIAGNOSIS — F411 Generalized anxiety disorder: Secondary | ICD-10-CM | POA: Diagnosis not present

## 2024-05-03 DIAGNOSIS — Z741 Need for assistance with personal care: Secondary | ICD-10-CM | POA: Diagnosis not present

## 2024-05-03 DIAGNOSIS — I4891 Unspecified atrial fibrillation: Secondary | ICD-10-CM | POA: Diagnosis not present

## 2024-05-03 DIAGNOSIS — M6281 Muscle weakness (generalized): Secondary | ICD-10-CM | POA: Diagnosis not present

## 2024-05-03 DIAGNOSIS — F5105 Insomnia due to other mental disorder: Secondary | ICD-10-CM | POA: Diagnosis not present

## 2024-05-03 DIAGNOSIS — I10 Essential (primary) hypertension: Secondary | ICD-10-CM | POA: Diagnosis not present

## 2024-05-03 DIAGNOSIS — R2681 Unsteadiness on feet: Secondary | ICD-10-CM | POA: Diagnosis not present

## 2024-05-03 DIAGNOSIS — F01B2 Vascular dementia, moderate, with psychotic disturbance: Secondary | ICD-10-CM | POA: Diagnosis not present

## 2024-05-03 DIAGNOSIS — Z7409 Other reduced mobility: Secondary | ICD-10-CM | POA: Diagnosis not present

## 2024-05-10 ENCOUNTER — Ambulatory Visit: Payer: Medicare HMO | Attending: Cardiology | Admitting: Cardiology

## 2024-05-10 ENCOUNTER — Encounter: Payer: Self-pay | Admitting: Cardiology

## 2024-05-10 VITALS — BP 118/60 | HR 58 | Ht 63.0 in | Wt 131.5 lb

## 2024-05-10 DIAGNOSIS — I10 Essential (primary) hypertension: Secondary | ICD-10-CM | POA: Diagnosis not present

## 2024-05-10 DIAGNOSIS — E782 Mixed hyperlipidemia: Secondary | ICD-10-CM | POA: Diagnosis not present

## 2024-05-10 DIAGNOSIS — I4821 Permanent atrial fibrillation: Secondary | ICD-10-CM

## 2024-05-10 NOTE — Progress Notes (Signed)
 Cardiology Office Note:    Date:  05/10/2024   ID:  Brittany Kramer, DOB 1926-08-08, MRN [id number]  PCP:  Patient, No Pcp Per  Cardiologist:  Nelia Balzarine, MD   Referring MD: No ref. provider found    ASSESSMENT:    1. Benign essential hypertension   2. Permanent atrial fibrillation (HCC)    PLAN:    In order of problems listed above:  Primary prevention stressed with the patient.  Importance of compliance with diet medication stressed and patient verbalized standing. Atrial fibrillation permanent:I discussed with the patient atrial fibrillation, disease process. Management and therapy including rate and rhythm control, anticoagulation benefits and potential risks were discussed extensively with the patient. Patient had multiple questions which were answered to patient's satisfaction. Anticoagulation: I discussed this with him at length.  She has not had any falls or any such issues. Mixed dyslipidemia: On lipid-lowering medications followed by primary care. Essential hypertension: Stable blood pressure.  Diet emphasized. Patient will be seen in follow-up appointment in 9 months or earlier if the patient has any concerns.    Medication Adjustments/Labs and Tests Ordered: Current medicines are reviewed at length with the patient today.  Concerns regarding medicines are outlined above.  Orders Placed This Encounter  Procedures   EKG 12-Lead   No orders of the defined types were placed in this encounter.    No chief complaint on file.    History of Present Illness:    Brittany Kramer is a 89 y.o. female.  Patient has past medical history of permanent atrial fibrillation and on anticoagulation.  She denies any problems at this time and takes care of activities of daily living.  She is now in a nursing home.  She is accompanied by her daughter and the attendant from the nursing home.  She is doing well and he has no symptoms.  No chest pain orthopnea or PND.  At the time  of my evaluation, the patient is alert awake oriented and in no distress.  Past Medical History:  Diagnosis Date   Acute blood loss anemia 08/04/2015   Acute GI bleeding 08/04/2015   Arrhythmia    Atrial fibrillation (HCC)    Benign essential hypertension 10/13/2018   Breast cancer (HCC)    Cancer (HCC)    CHF (congestive heart failure) (HCC)    Diabetes mellitus without complication (HCC)    Diverticulitis    Diverticulosis 08/04/2015   GERD (gastroesophageal reflux disease)    Gestational diabetes, diet controlled    History of pacemaker 08/04/2015   HLD (hyperlipidemia) 08/04/2015   HTN (hypertension) 08/04/2015   Hyperlipidemia    Hypertension    Hypothyroidism    Iron deficiency anemia 08/04/2015   Pacemaker 08/27/2017   Pacemaker battery depletion 08/27/2017   Permanent atrial fibrillation (HCC) 03/04/2021   Rectal prolapse 12/04/2022   SSS (sick sinus syndrome) (HCC) 08/14/2016   Thyroid  disease     Past Surgical History:  Procedure Laterality Date   COLONOSCOPY  2004   ESOPHAGOGASTRODUODENOSCOPY N/A 08/04/2015   Procedure: ESOPHAGOGASTRODUODENOSCOPY (EGD);  Surgeon: Ozell Blunt, MD;  Location: [facility] ENDOSCOPY;  Service: Endoscopy;  Laterality: N/A;   INSERT / REPLACE / REMOVE PACEMAKER      Current Medications: Current Meds  Medication Sig   acetaminophen  (TYLENOL ) 650 MG CR tablet Take 650 mg by mouth 3 (three) times daily.   atorvastatin  (LIPITOR) 20 MG tablet Take 20 mg by mouth daily.   bethanechol (URECHOLINE) 25 MG tablet Take 50 mg by  mouth 2 (two) times daily.   ELIQUIS 2.5 MG TABS tablet Take 2.5 mg by mouth 2 (two) times daily.   levothyroxine  (SYNTHROID , LEVOTHROID) 100 MCG tablet Take 100 mcg by mouth daily.   metoprolol  tartrate (LOPRESSOR ) 25 MG tablet Take 12.5 mg by mouth 2 (two) times daily.   nitrofurantoin (MACRODANTIN) 50 MG capsule Take 50 mg by mouth daily.   potassium chloride  20 MEQ/15ML (10%) SOLN Take 40 mEq by mouth 2 (two) times daily.    QUEtiapine (SEROQUEL) 50 MG tablet Take 50 mg by mouth daily.    sertraline (ZOLOFT) 50 MG tablet Take 50 mg by mouth daily.   tamsulosin (FLOMAX) 0.4 MG CAPS capsule Take 0.4 mg by mouth daily.   TIADYLT ER 120 MG 24 hr capsule Take 120 mg by mouth daily.     Allergies:   Tramadol   Social History   Socioeconomic History   Marital status: Married    Spouse name: Not on file   Number of children: Not on file   Years of education: Not on file   Highest education level: Not on file  Occupational History   Not on file  Tobacco Use   Smoking status: Never   Smokeless tobacco: Never  Substance and Sexual Activity   Alcohol use: No   Drug use: No   Sexual activity: Not on file  Other Topics Concern   Not on file  Social History Narrative   Not on file   Social Drivers of Health   Financial Resource Strain: Not on file  Food Insecurity: Not on file  Transportation Needs: Not on file  Physical Activity: Not on file  Stress: Not on file  Social Connections: Not on file     Family History: The patient's family history includes AAA (abdominal aortic aneurysm) in her brother and mother; Heart disease in her father.  ROS:   Please see the history of present illness.    All other systems reviewed and are negative.  EKGs/Labs/Other Studies Reviewed:    The following studies were reviewed today: .Aaron AasEKG Interpretation Date/Time:  Tuesday May 10 2024 13:11:03 EDT Ventricular Rate:  82 PR Interval:    QRS Duration:  118 QT Interval:  442 QTC Calculation: 516 R Axis:   37  Text Interpretation: Atrial fibrillation Incomplete right bundle branch block Septal infarct , age undetermined T wave abnormality, consider anterolateral ischemia Prolonged QT When compared with ECG of 25-Sep-2004 14:02, Current undetermined rhythm precludes rhythm comparison, needs review Incomplete right bundle branch block is now Present Septal infarct is now Present Confirmed by Hillis Lu  [phone]) on 05/10/2024 1:12:44 PM     Recent Labs: No results found for requested labs within last 365 days.  Recent Lipid Panel No results found for: "CHOL", "TRIG", "HDL", "CHOLHDL", "VLDL", "LDLCALC", "LDLDIRECT"  Physical Exam:    VS:  LMP  (LMP Unknown)     Wt Readings from Last 3 Encounters:  08/05/23 139 lb 3.2 oz (63.1 kg)  05/05/22 134 lb 3.2 oz (60.9 kg)  09/11/21 140 lb 6.4 oz (63.7 kg)     GEN: Patient is in no acute distress HEENT: Normal NECK: No JVD; No carotid bruits LYMPHATICS: No lymphadenopathy CARDIAC: Hear sounds regular, 2/6 systolic murmur at the apex. RESPIRATORY:  Clear to auscultation without rales, wheezing or rhonchi  ABDOMEN: Soft, non-tender, non-distended MUSCULOSKELETAL:  No edema; No deformity  SKIN: Warm and dry NEUROLOGIC:  Alert and oriented x 3 PSYCHIATRIC:  Normal affect   Signed,  Nelia Balzarine, MD  05/10/2024 1:13 PM    [facility]

## 2024-05-10 NOTE — Patient Instructions (Signed)
 Medication Instructions:  Your physician recommends that you continue on your current medications as directed. Please refer to the Current Medication list given to you today.  *If you need a refill on your cardiac medications before your next appointment, please call your pharmacy*   Follow-Up: At [facility], you and your health needs are our priority.  As part of our continuing mission to provide you with exceptional heart care, our providers are all part of one team.  This team includes your primary Cardiologist (physician) and Advanced Practice Providers or APPs (Physician Assistants and Nurse Practitioners) who all work together to provide you with the care you need, when you need it.  Your next appointment:   9 month(s)  Provider:   Hillis Lu, MD

## 2024-05-30 DIAGNOSIS — F29 Unspecified psychosis not due to a substance or known physiological condition: Secondary | ICD-10-CM | POA: Diagnosis not present

## 2024-05-30 DIAGNOSIS — F32A Depression, unspecified: Secondary | ICD-10-CM | POA: Diagnosis not present

## 2024-05-30 DIAGNOSIS — F419 Anxiety disorder, unspecified: Secondary | ICD-10-CM | POA: Diagnosis not present

## 2024-05-30 DIAGNOSIS — G4700 Insomnia, unspecified: Secondary | ICD-10-CM | POA: Diagnosis not present

## 2024-06-02 DIAGNOSIS — D529 Folate deficiency anemia, unspecified: Secondary | ICD-10-CM | POA: Diagnosis not present

## 2024-06-02 DIAGNOSIS — E119 Type 2 diabetes mellitus without complications: Secondary | ICD-10-CM | POA: Diagnosis not present

## 2024-06-02 DIAGNOSIS — D509 Iron deficiency anemia, unspecified: Secondary | ICD-10-CM | POA: Diagnosis not present

## 2024-06-09 DIAGNOSIS — H6123 Impacted cerumen, bilateral: Secondary | ICD-10-CM | POA: Diagnosis not present

## 2024-06-11 DIAGNOSIS — E559 Vitamin D deficiency, unspecified: Secondary | ICD-10-CM | POA: Diagnosis not present

## 2024-06-14 DIAGNOSIS — F411 Generalized anxiety disorder: Secondary | ICD-10-CM | POA: Diagnosis not present

## 2024-06-14 DIAGNOSIS — F5105 Insomnia due to other mental disorder: Secondary | ICD-10-CM | POA: Diagnosis not present

## 2024-06-14 DIAGNOSIS — F01B2 Vascular dementia, moderate, with psychotic disturbance: Secondary | ICD-10-CM | POA: Diagnosis not present

## 2024-06-14 DIAGNOSIS — I10 Essential (primary) hypertension: Secondary | ICD-10-CM | POA: Diagnosis not present

## 2024-06-28 DIAGNOSIS — I509 Heart failure, unspecified: Secondary | ICD-10-CM | POA: Diagnosis not present

## 2024-06-28 DIAGNOSIS — I10 Essential (primary) hypertension: Secondary | ICD-10-CM | POA: Diagnosis not present

## 2024-06-28 DIAGNOSIS — I4891 Unspecified atrial fibrillation: Secondary | ICD-10-CM | POA: Diagnosis not present

## 2024-06-28 DIAGNOSIS — E039 Hypothyroidism, unspecified: Secondary | ICD-10-CM | POA: Diagnosis not present

## 2024-07-06 DIAGNOSIS — F411 Generalized anxiety disorder: Secondary | ICD-10-CM | POA: Diagnosis not present

## 2024-07-06 DIAGNOSIS — I10 Essential (primary) hypertension: Secondary | ICD-10-CM | POA: Diagnosis not present

## 2024-07-06 DIAGNOSIS — F5105 Insomnia due to other mental disorder: Secondary | ICD-10-CM | POA: Diagnosis not present

## 2024-07-06 DIAGNOSIS — F01B2 Vascular dementia, moderate, with psychotic disturbance: Secondary | ICD-10-CM | POA: Diagnosis not present

## 2024-07-15 DIAGNOSIS — L089 Local infection of the skin and subcutaneous tissue, unspecified: Secondary | ICD-10-CM | POA: Diagnosis not present

## 2024-07-18 DIAGNOSIS — F32A Depression, unspecified: Secondary | ICD-10-CM | POA: Diagnosis not present

## 2024-07-18 DIAGNOSIS — F29 Unspecified psychosis not due to a substance or known physiological condition: Secondary | ICD-10-CM | POA: Diagnosis not present

## 2024-07-18 DIAGNOSIS — F419 Anxiety disorder, unspecified: Secondary | ICD-10-CM | POA: Diagnosis not present

## 2024-07-18 DIAGNOSIS — I10 Essential (primary) hypertension: Secondary | ICD-10-CM | POA: Diagnosis not present

## 2024-07-20 DIAGNOSIS — F411 Generalized anxiety disorder: Secondary | ICD-10-CM | POA: Diagnosis not present

## 2024-07-20 DIAGNOSIS — F5105 Insomnia due to other mental disorder: Secondary | ICD-10-CM | POA: Diagnosis not present

## 2024-07-20 DIAGNOSIS — I10 Essential (primary) hypertension: Secondary | ICD-10-CM | POA: Diagnosis not present

## 2024-07-20 DIAGNOSIS — F01B2 Vascular dementia, moderate, with psychotic disturbance: Secondary | ICD-10-CM | POA: Diagnosis not present

## 2024-07-21 DIAGNOSIS — I4891 Unspecified atrial fibrillation: Secondary | ICD-10-CM | POA: Diagnosis not present

## 2024-07-21 DIAGNOSIS — H6122 Impacted cerumen, left ear: Secondary | ICD-10-CM | POA: Diagnosis not present

## 2024-07-21 DIAGNOSIS — Z741 Need for assistance with personal care: Secondary | ICD-10-CM | POA: Diagnosis not present

## 2024-07-21 DIAGNOSIS — Z7409 Other reduced mobility: Secondary | ICD-10-CM | POA: Diagnosis not present

## 2024-07-21 DIAGNOSIS — M6281 Muscle weakness (generalized): Secondary | ICD-10-CM | POA: Diagnosis not present

## 2024-07-21 DIAGNOSIS — R2681 Unsteadiness on feet: Secondary | ICD-10-CM | POA: Diagnosis not present

## 2024-07-21 DIAGNOSIS — S51812A Laceration without foreign body of left forearm, initial encounter: Secondary | ICD-10-CM | POA: Diagnosis not present

## 2024-07-25 DIAGNOSIS — M6281 Muscle weakness (generalized): Secondary | ICD-10-CM | POA: Diagnosis not present

## 2024-07-25 DIAGNOSIS — R2681 Unsteadiness on feet: Secondary | ICD-10-CM | POA: Diagnosis not present

## 2024-07-25 DIAGNOSIS — Z7409 Other reduced mobility: Secondary | ICD-10-CM | POA: Diagnosis not present

## 2024-07-25 DIAGNOSIS — Z741 Need for assistance with personal care: Secondary | ICD-10-CM | POA: Diagnosis not present

## 2024-07-25 DIAGNOSIS — I4891 Unspecified atrial fibrillation: Secondary | ICD-10-CM | POA: Diagnosis not present

## 2024-07-26 DIAGNOSIS — Z741 Need for assistance with personal care: Secondary | ICD-10-CM | POA: Diagnosis not present

## 2024-07-26 DIAGNOSIS — Z7409 Other reduced mobility: Secondary | ICD-10-CM | POA: Diagnosis not present

## 2024-07-26 DIAGNOSIS — R2681 Unsteadiness on feet: Secondary | ICD-10-CM | POA: Diagnosis not present

## 2024-07-26 DIAGNOSIS — I4891 Unspecified atrial fibrillation: Secondary | ICD-10-CM | POA: Diagnosis not present

## 2024-07-26 DIAGNOSIS — M6281 Muscle weakness (generalized): Secondary | ICD-10-CM | POA: Diagnosis not present

## 2024-07-27 DIAGNOSIS — M6281 Muscle weakness (generalized): Secondary | ICD-10-CM | POA: Diagnosis not present

## 2024-07-27 DIAGNOSIS — Z741 Need for assistance with personal care: Secondary | ICD-10-CM | POA: Diagnosis not present

## 2024-07-27 DIAGNOSIS — I4891 Unspecified atrial fibrillation: Secondary | ICD-10-CM | POA: Diagnosis not present

## 2024-07-27 DIAGNOSIS — Z7409 Other reduced mobility: Secondary | ICD-10-CM | POA: Diagnosis not present

## 2024-07-27 DIAGNOSIS — R2681 Unsteadiness on feet: Secondary | ICD-10-CM | POA: Diagnosis not present

## 2024-07-28 DIAGNOSIS — Z741 Need for assistance with personal care: Secondary | ICD-10-CM | POA: Diagnosis not present

## 2024-07-28 DIAGNOSIS — Z7409 Other reduced mobility: Secondary | ICD-10-CM | POA: Diagnosis not present

## 2024-07-28 DIAGNOSIS — M6281 Muscle weakness (generalized): Secondary | ICD-10-CM | POA: Diagnosis not present

## 2024-07-28 DIAGNOSIS — R2681 Unsteadiness on feet: Secondary | ICD-10-CM | POA: Diagnosis not present

## 2024-07-28 DIAGNOSIS — I4891 Unspecified atrial fibrillation: Secondary | ICD-10-CM | POA: Diagnosis not present

## 2024-07-29 DIAGNOSIS — I4891 Unspecified atrial fibrillation: Secondary | ICD-10-CM | POA: Diagnosis not present

## 2024-07-29 DIAGNOSIS — Z7409 Other reduced mobility: Secondary | ICD-10-CM | POA: Diagnosis not present

## 2024-07-29 DIAGNOSIS — Z741 Need for assistance with personal care: Secondary | ICD-10-CM | POA: Diagnosis not present

## 2024-07-29 DIAGNOSIS — R2681 Unsteadiness on feet: Secondary | ICD-10-CM | POA: Diagnosis not present

## 2024-07-29 DIAGNOSIS — M6281 Muscle weakness (generalized): Secondary | ICD-10-CM | POA: Diagnosis not present

## 2024-08-01 DIAGNOSIS — Z7409 Other reduced mobility: Secondary | ICD-10-CM | POA: Diagnosis not present

## 2024-08-01 DIAGNOSIS — I4891 Unspecified atrial fibrillation: Secondary | ICD-10-CM | POA: Diagnosis not present

## 2024-08-01 DIAGNOSIS — R2681 Unsteadiness on feet: Secondary | ICD-10-CM | POA: Diagnosis not present

## 2024-08-01 DIAGNOSIS — Z741 Need for assistance with personal care: Secondary | ICD-10-CM | POA: Diagnosis not present

## 2024-08-02 DIAGNOSIS — Z7409 Other reduced mobility: Secondary | ICD-10-CM | POA: Diagnosis not present

## 2024-08-02 DIAGNOSIS — Z741 Need for assistance with personal care: Secondary | ICD-10-CM | POA: Diagnosis not present

## 2024-08-02 DIAGNOSIS — R2681 Unsteadiness on feet: Secondary | ICD-10-CM | POA: Diagnosis not present

## 2024-08-02 DIAGNOSIS — I4891 Unspecified atrial fibrillation: Secondary | ICD-10-CM | POA: Diagnosis not present

## 2024-08-03 DIAGNOSIS — I4891 Unspecified atrial fibrillation: Secondary | ICD-10-CM | POA: Diagnosis not present

## 2024-08-03 DIAGNOSIS — Z741 Need for assistance with personal care: Secondary | ICD-10-CM | POA: Diagnosis not present

## 2024-08-03 DIAGNOSIS — R2681 Unsteadiness on feet: Secondary | ICD-10-CM | POA: Diagnosis not present

## 2024-08-03 DIAGNOSIS — Z7409 Other reduced mobility: Secondary | ICD-10-CM | POA: Diagnosis not present

## 2024-08-03 DIAGNOSIS — M6281 Muscle weakness (generalized): Secondary | ICD-10-CM | POA: Diagnosis not present

## 2024-08-06 DIAGNOSIS — E559 Vitamin D deficiency, unspecified: Secondary | ICD-10-CM | POA: Diagnosis not present

## 2024-08-09 DIAGNOSIS — I4891 Unspecified atrial fibrillation: Secondary | ICD-10-CM | POA: Diagnosis not present

## 2024-08-09 DIAGNOSIS — Z7409 Other reduced mobility: Secondary | ICD-10-CM | POA: Diagnosis not present

## 2024-08-09 DIAGNOSIS — Z741 Need for assistance with personal care: Secondary | ICD-10-CM | POA: Diagnosis not present

## 2024-08-09 DIAGNOSIS — R2681 Unsteadiness on feet: Secondary | ICD-10-CM | POA: Diagnosis not present

## 2024-08-11 DIAGNOSIS — Z7409 Other reduced mobility: Secondary | ICD-10-CM | POA: Diagnosis not present

## 2024-08-11 DIAGNOSIS — R2681 Unsteadiness on feet: Secondary | ICD-10-CM | POA: Diagnosis not present

## 2024-08-11 DIAGNOSIS — Z741 Need for assistance with personal care: Secondary | ICD-10-CM | POA: Diagnosis not present

## 2024-08-11 DIAGNOSIS — I4891 Unspecified atrial fibrillation: Secondary | ICD-10-CM | POA: Diagnosis not present

## 2024-08-16 DIAGNOSIS — I4891 Unspecified atrial fibrillation: Secondary | ICD-10-CM | POA: Diagnosis not present

## 2024-08-16 DIAGNOSIS — I10 Essential (primary) hypertension: Secondary | ICD-10-CM | POA: Diagnosis not present

## 2024-08-16 DIAGNOSIS — I509 Heart failure, unspecified: Secondary | ICD-10-CM | POA: Diagnosis not present

## 2024-08-16 DIAGNOSIS — E785 Hyperlipidemia, unspecified: Secondary | ICD-10-CM | POA: Diagnosis not present

## 2024-08-17 DIAGNOSIS — F5105 Insomnia due to other mental disorder: Secondary | ICD-10-CM | POA: Diagnosis not present

## 2024-08-17 DIAGNOSIS — I10 Essential (primary) hypertension: Secondary | ICD-10-CM | POA: Diagnosis not present

## 2024-08-17 DIAGNOSIS — F01B2 Vascular dementia, moderate, with psychotic disturbance: Secondary | ICD-10-CM | POA: Diagnosis not present

## 2024-08-17 DIAGNOSIS — F411 Generalized anxiety disorder: Secondary | ICD-10-CM | POA: Diagnosis not present

## 2024-08-17 DIAGNOSIS — M6281 Muscle weakness (generalized): Secondary | ICD-10-CM | POA: Diagnosis not present

## 2024-08-17 DIAGNOSIS — I4891 Unspecified atrial fibrillation: Secondary | ICD-10-CM | POA: Diagnosis not present

## 2024-08-17 DIAGNOSIS — Z7409 Other reduced mobility: Secondary | ICD-10-CM | POA: Diagnosis not present

## 2024-08-17 DIAGNOSIS — Z741 Need for assistance with personal care: Secondary | ICD-10-CM | POA: Diagnosis not present

## 2024-08-17 DIAGNOSIS — R2681 Unsteadiness on feet: Secondary | ICD-10-CM | POA: Diagnosis not present

## 2024-08-18 DIAGNOSIS — M6281 Muscle weakness (generalized): Secondary | ICD-10-CM | POA: Diagnosis not present

## 2024-08-18 DIAGNOSIS — Z7409 Other reduced mobility: Secondary | ICD-10-CM | POA: Diagnosis not present

## 2024-08-18 DIAGNOSIS — H1045 Other chronic allergic conjunctivitis: Secondary | ICD-10-CM | POA: Diagnosis not present

## 2024-08-18 DIAGNOSIS — Z741 Need for assistance with personal care: Secondary | ICD-10-CM | POA: Diagnosis not present

## 2024-08-18 DIAGNOSIS — R2681 Unsteadiness on feet: Secondary | ICD-10-CM | POA: Diagnosis not present

## 2024-08-18 DIAGNOSIS — I4891 Unspecified atrial fibrillation: Secondary | ICD-10-CM | POA: Diagnosis not present

## 2024-08-18 DIAGNOSIS — H04129 Dry eye syndrome of unspecified lacrimal gland: Secondary | ICD-10-CM | POA: Diagnosis not present

## 2024-08-23 DIAGNOSIS — M6281 Muscle weakness (generalized): Secondary | ICD-10-CM | POA: Diagnosis not present

## 2024-08-23 DIAGNOSIS — R2681 Unsteadiness on feet: Secondary | ICD-10-CM | POA: Diagnosis not present

## 2024-08-23 DIAGNOSIS — I4891 Unspecified atrial fibrillation: Secondary | ICD-10-CM | POA: Diagnosis not present

## 2024-08-23 DIAGNOSIS — Z7409 Other reduced mobility: Secondary | ICD-10-CM | POA: Diagnosis not present

## 2024-08-23 DIAGNOSIS — Z741 Need for assistance with personal care: Secondary | ICD-10-CM | POA: Diagnosis not present

## 2024-08-24 DIAGNOSIS — R2681 Unsteadiness on feet: Secondary | ICD-10-CM | POA: Diagnosis not present

## 2024-08-24 DIAGNOSIS — I4891 Unspecified atrial fibrillation: Secondary | ICD-10-CM | POA: Diagnosis not present

## 2024-08-24 DIAGNOSIS — Z7409 Other reduced mobility: Secondary | ICD-10-CM | POA: Diagnosis not present

## 2024-08-24 DIAGNOSIS — Z741 Need for assistance with personal care: Secondary | ICD-10-CM | POA: Diagnosis not present

## 2024-08-25 DIAGNOSIS — M6281 Muscle weakness (generalized): Secondary | ICD-10-CM | POA: Diagnosis not present

## 2024-08-25 DIAGNOSIS — I4891 Unspecified atrial fibrillation: Secondary | ICD-10-CM | POA: Diagnosis not present

## 2024-08-25 DIAGNOSIS — Z7409 Other reduced mobility: Secondary | ICD-10-CM | POA: Diagnosis not present

## 2024-08-25 DIAGNOSIS — Z741 Need for assistance with personal care: Secondary | ICD-10-CM | POA: Diagnosis not present

## 2024-08-25 DIAGNOSIS — R2681 Unsteadiness on feet: Secondary | ICD-10-CM | POA: Diagnosis not present

## 2024-08-29 DIAGNOSIS — I4891 Unspecified atrial fibrillation: Secondary | ICD-10-CM | POA: Diagnosis not present

## 2024-08-29 DIAGNOSIS — R2681 Unsteadiness on feet: Secondary | ICD-10-CM | POA: Diagnosis not present

## 2024-08-29 DIAGNOSIS — Z741 Need for assistance with personal care: Secondary | ICD-10-CM | POA: Diagnosis not present

## 2024-08-29 DIAGNOSIS — Z7409 Other reduced mobility: Secondary | ICD-10-CM | POA: Diagnosis not present

## 2024-08-30 DIAGNOSIS — Z7409 Other reduced mobility: Secondary | ICD-10-CM | POA: Diagnosis not present

## 2024-08-30 DIAGNOSIS — R2681 Unsteadiness on feet: Secondary | ICD-10-CM | POA: Diagnosis not present

## 2024-08-30 DIAGNOSIS — Z741 Need for assistance with personal care: Secondary | ICD-10-CM | POA: Diagnosis not present

## 2024-08-30 DIAGNOSIS — I4891 Unspecified atrial fibrillation: Secondary | ICD-10-CM | POA: Diagnosis not present

## 2024-08-31 DIAGNOSIS — F411 Generalized anxiety disorder: Secondary | ICD-10-CM | POA: Diagnosis not present

## 2024-08-31 DIAGNOSIS — F01B2 Vascular dementia, moderate, with psychotic disturbance: Secondary | ICD-10-CM | POA: Diagnosis not present

## 2024-08-31 DIAGNOSIS — F5105 Insomnia due to other mental disorder: Secondary | ICD-10-CM | POA: Diagnosis not present

## 2024-08-31 DIAGNOSIS — I10 Essential (primary) hypertension: Secondary | ICD-10-CM | POA: Diagnosis not present

## 2024-09-01 DIAGNOSIS — I4891 Unspecified atrial fibrillation: Secondary | ICD-10-CM | POA: Diagnosis not present

## 2024-09-01 DIAGNOSIS — Z741 Need for assistance with personal care: Secondary | ICD-10-CM | POA: Diagnosis not present

## 2024-09-01 DIAGNOSIS — Z7409 Other reduced mobility: Secondary | ICD-10-CM | POA: Diagnosis not present

## 2024-09-01 DIAGNOSIS — R2681 Unsteadiness on feet: Secondary | ICD-10-CM | POA: Diagnosis not present

## 2024-09-05 DIAGNOSIS — I4891 Unspecified atrial fibrillation: Secondary | ICD-10-CM | POA: Diagnosis not present

## 2024-09-05 DIAGNOSIS — Z7409 Other reduced mobility: Secondary | ICD-10-CM | POA: Diagnosis not present

## 2024-09-05 DIAGNOSIS — Z741 Need for assistance with personal care: Secondary | ICD-10-CM | POA: Diagnosis not present

## 2024-09-05 DIAGNOSIS — M6281 Muscle weakness (generalized): Secondary | ICD-10-CM | POA: Diagnosis not present

## 2024-09-05 DIAGNOSIS — R2681 Unsteadiness on feet: Secondary | ICD-10-CM | POA: Diagnosis not present

## 2024-09-06 DIAGNOSIS — Z741 Need for assistance with personal care: Secondary | ICD-10-CM | POA: Diagnosis not present

## 2024-09-06 DIAGNOSIS — I4891 Unspecified atrial fibrillation: Secondary | ICD-10-CM | POA: Diagnosis not present

## 2024-09-06 DIAGNOSIS — R2681 Unsteadiness on feet: Secondary | ICD-10-CM | POA: Diagnosis not present

## 2024-09-06 DIAGNOSIS — Z7409 Other reduced mobility: Secondary | ICD-10-CM | POA: Diagnosis not present

## 2024-09-08 DIAGNOSIS — Z7409 Other reduced mobility: Secondary | ICD-10-CM | POA: Diagnosis not present

## 2024-09-08 DIAGNOSIS — I4891 Unspecified atrial fibrillation: Secondary | ICD-10-CM | POA: Diagnosis not present

## 2024-09-08 DIAGNOSIS — R2681 Unsteadiness on feet: Secondary | ICD-10-CM | POA: Diagnosis not present

## 2024-09-08 DIAGNOSIS — Z741 Need for assistance with personal care: Secondary | ICD-10-CM | POA: Diagnosis not present

## 2024-09-09 DIAGNOSIS — I4891 Unspecified atrial fibrillation: Secondary | ICD-10-CM | POA: Diagnosis not present

## 2024-09-09 DIAGNOSIS — R2681 Unsteadiness on feet: Secondary | ICD-10-CM | POA: Diagnosis not present

## 2024-09-09 DIAGNOSIS — Z741 Need for assistance with personal care: Secondary | ICD-10-CM | POA: Diagnosis not present

## 2024-09-09 DIAGNOSIS — Z7409 Other reduced mobility: Secondary | ICD-10-CM | POA: Diagnosis not present

## 2024-09-14 DIAGNOSIS — Z7409 Other reduced mobility: Secondary | ICD-10-CM | POA: Diagnosis not present

## 2024-09-14 DIAGNOSIS — Z741 Need for assistance with personal care: Secondary | ICD-10-CM | POA: Diagnosis not present

## 2024-09-14 DIAGNOSIS — F411 Generalized anxiety disorder: Secondary | ICD-10-CM | POA: Diagnosis not present

## 2024-09-14 DIAGNOSIS — F01B2 Vascular dementia, moderate, with psychotic disturbance: Secondary | ICD-10-CM | POA: Diagnosis not present

## 2024-09-14 DIAGNOSIS — I10 Essential (primary) hypertension: Secondary | ICD-10-CM | POA: Diagnosis not present

## 2024-09-14 DIAGNOSIS — M6281 Muscle weakness (generalized): Secondary | ICD-10-CM | POA: Diagnosis not present

## 2024-09-14 DIAGNOSIS — R2681 Unsteadiness on feet: Secondary | ICD-10-CM | POA: Diagnosis not present

## 2024-09-14 DIAGNOSIS — F5105 Insomnia due to other mental disorder: Secondary | ICD-10-CM | POA: Diagnosis not present

## 2024-09-14 DIAGNOSIS — I4891 Unspecified atrial fibrillation: Secondary | ICD-10-CM | POA: Diagnosis not present

## 2024-09-15 DIAGNOSIS — Z7409 Other reduced mobility: Secondary | ICD-10-CM | POA: Diagnosis not present

## 2024-09-15 DIAGNOSIS — Z741 Need for assistance with personal care: Secondary | ICD-10-CM | POA: Diagnosis not present

## 2024-09-15 DIAGNOSIS — I4891 Unspecified atrial fibrillation: Secondary | ICD-10-CM | POA: Diagnosis not present

## 2024-09-15 DIAGNOSIS — R2681 Unsteadiness on feet: Secondary | ICD-10-CM | POA: Diagnosis not present

## 2024-09-19 DIAGNOSIS — I4891 Unspecified atrial fibrillation: Secondary | ICD-10-CM | POA: Diagnosis not present

## 2024-09-19 DIAGNOSIS — R2681 Unsteadiness on feet: Secondary | ICD-10-CM | POA: Diagnosis not present

## 2024-09-19 DIAGNOSIS — Z7409 Other reduced mobility: Secondary | ICD-10-CM | POA: Diagnosis not present

## 2024-09-19 DIAGNOSIS — Z741 Need for assistance with personal care: Secondary | ICD-10-CM | POA: Diagnosis not present

## 2024-09-20 DIAGNOSIS — Z7409 Other reduced mobility: Secondary | ICD-10-CM | POA: Diagnosis not present

## 2024-09-20 DIAGNOSIS — R2681 Unsteadiness on feet: Secondary | ICD-10-CM | POA: Diagnosis not present

## 2024-09-20 DIAGNOSIS — E785 Hyperlipidemia, unspecified: Secondary | ICD-10-CM | POA: Diagnosis not present

## 2024-09-20 DIAGNOSIS — I509 Heart failure, unspecified: Secondary | ICD-10-CM | POA: Diagnosis not present

## 2024-09-20 DIAGNOSIS — I10 Essential (primary) hypertension: Secondary | ICD-10-CM | POA: Diagnosis not present

## 2024-09-20 DIAGNOSIS — I4891 Unspecified atrial fibrillation: Secondary | ICD-10-CM | POA: Diagnosis not present

## 2024-09-20 DIAGNOSIS — Z741 Need for assistance with personal care: Secondary | ICD-10-CM | POA: Diagnosis not present

## 2024-09-22 DIAGNOSIS — Z7409 Other reduced mobility: Secondary | ICD-10-CM | POA: Diagnosis not present

## 2024-09-22 DIAGNOSIS — I4891 Unspecified atrial fibrillation: Secondary | ICD-10-CM | POA: Diagnosis not present

## 2024-09-22 DIAGNOSIS — M6281 Muscle weakness (generalized): Secondary | ICD-10-CM | POA: Diagnosis not present

## 2024-09-22 DIAGNOSIS — Z741 Need for assistance with personal care: Secondary | ICD-10-CM | POA: Diagnosis not present

## 2024-09-22 DIAGNOSIS — R2681 Unsteadiness on feet: Secondary | ICD-10-CM | POA: Diagnosis not present

## 2024-09-28 DIAGNOSIS — F411 Generalized anxiety disorder: Secondary | ICD-10-CM | POA: Diagnosis not present

## 2024-09-28 DIAGNOSIS — I10 Essential (primary) hypertension: Secondary | ICD-10-CM | POA: Diagnosis not present

## 2024-09-28 DIAGNOSIS — F5105 Insomnia due to other mental disorder: Secondary | ICD-10-CM | POA: Diagnosis not present

## 2024-09-28 DIAGNOSIS — F01B2 Vascular dementia, moderate, with psychotic disturbance: Secondary | ICD-10-CM | POA: Diagnosis not present

## 2024-10-10 DIAGNOSIS — I10 Essential (primary) hypertension: Secondary | ICD-10-CM | POA: Diagnosis not present

## 2024-10-10 DIAGNOSIS — F411 Generalized anxiety disorder: Secondary | ICD-10-CM | POA: Diagnosis not present

## 2024-10-10 DIAGNOSIS — F01B2 Vascular dementia, moderate, with psychotic disturbance: Secondary | ICD-10-CM | POA: Diagnosis not present

## 2024-10-10 DIAGNOSIS — F5105 Insomnia due to other mental disorder: Secondary | ICD-10-CM | POA: Diagnosis not present

## 2024-10-24 DIAGNOSIS — I10 Essential (primary) hypertension: Secondary | ICD-10-CM | POA: Diagnosis not present

## 2024-10-24 DIAGNOSIS — F411 Generalized anxiety disorder: Secondary | ICD-10-CM | POA: Diagnosis not present

## 2024-10-24 DIAGNOSIS — F5105 Insomnia due to other mental disorder: Secondary | ICD-10-CM | POA: Diagnosis not present

## 2024-10-24 DIAGNOSIS — F01B2 Vascular dementia, moderate, with psychotic disturbance: Secondary | ICD-10-CM | POA: Diagnosis not present

## 2024-11-03 DIAGNOSIS — H6122 Impacted cerumen, left ear: Secondary | ICD-10-CM | POA: Diagnosis not present

## 2024-11-07 DIAGNOSIS — F01B2 Vascular dementia, moderate, with psychotic disturbance: Secondary | ICD-10-CM | POA: Diagnosis not present

## 2024-11-07 DIAGNOSIS — F411 Generalized anxiety disorder: Secondary | ICD-10-CM | POA: Diagnosis not present

## 2024-11-07 DIAGNOSIS — F5105 Insomnia due to other mental disorder: Secondary | ICD-10-CM | POA: Diagnosis not present

## 2024-11-07 DIAGNOSIS — I10 Essential (primary) hypertension: Secondary | ICD-10-CM | POA: Diagnosis not present

## 2024-11-15 DIAGNOSIS — B351 Tinea unguium: Secondary | ICD-10-CM | POA: Diagnosis not present

## 2024-11-15 DIAGNOSIS — E1159 Type 2 diabetes mellitus with other circulatory complications: Secondary | ICD-10-CM | POA: Diagnosis not present

## 2025-02-08 ENCOUNTER — Ambulatory Visit: Admitting: Cardiology
